# Patient Record
Sex: Female | Born: 1995 | Race: Black or African American | Hispanic: No | Marital: Single | Smoking: Never smoker
Health system: Southern US, Community
[De-identification: ages and names within clinical notes are randomized; demographics above are authoritative.]

## PROBLEM LIST (undated history)

## (undated) DIAGNOSIS — D649 Anemia, unspecified: Secondary | ICD-10-CM

---
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
## 2014-01-03 ENCOUNTER — Observation Stay (HOSPITAL_COMMUNITY)
Admission: EM | Admit: 2014-01-03 | Discharge: 2014-01-04 | Disposition: A | Discharge status: Home / Self Care | Payer: Self-pay | Attending: Otolaryngology | Admitting: Otolaryngology

## 2014-01-03 ENCOUNTER — Encounter (HOSPITAL_COMMUNITY): Payer: Self-pay | Admitting: Emergency Medicine

## 2014-01-03 ENCOUNTER — Emergency Department (HOSPITAL_COMMUNITY): Payer: Self-pay | Admitting: Certified Registered"

## 2014-01-03 ENCOUNTER — Emergency Department (HOSPITAL_COMMUNITY): Payer: Self-pay

## 2014-01-03 ENCOUNTER — Encounter (HOSPITAL_COMMUNITY)
Admission: EM | Disposition: A | Discharge status: Home / Self Care | Payer: Self-pay | Source: Home / Self Care | Attending: Emergency Medicine

## 2014-01-03 ENCOUNTER — Encounter (HOSPITAL_COMMUNITY): Payer: MEDICAID | Admitting: Certified Registered"

## 2014-01-03 DIAGNOSIS — Y998 Other external cause status: Secondary | ICD-10-CM | POA: Insufficient documentation

## 2014-01-03 DIAGNOSIS — Y9239 Other specified sports and athletic area as the place of occurrence of the external cause: Secondary | ICD-10-CM | POA: Insufficient documentation

## 2014-01-03 DIAGNOSIS — S62639A Displaced fracture of distal phalanx of unspecified finger, initial encounter for closed fracture: Secondary | ICD-10-CM | POA: Insufficient documentation

## 2014-01-03 DIAGNOSIS — Y9364 Activity, baseball: Secondary | ICD-10-CM | POA: Insufficient documentation

## 2014-01-03 DIAGNOSIS — S02609A Fracture of mandible, unspecified, initial encounter for closed fracture: Principal | ICD-10-CM | POA: Insufficient documentation

## 2014-01-03 DIAGNOSIS — S02609B Fracture of mandible, unspecified, initial encounter for open fracture: Secondary | ICD-10-CM

## 2014-01-03 DIAGNOSIS — Y92838 Other recreation area as the place of occurrence of the external cause: Secondary | ICD-10-CM

## 2014-01-03 DIAGNOSIS — W219XXA Striking against or struck by unspecified sports equipment, initial encounter: Secondary | ICD-10-CM | POA: Insufficient documentation

## 2014-01-03 HISTORY — PX: ORIF MANDIBULAR FRACTURE: SHX2127

## 2014-01-03 LAB — BASIC METABOLIC PANEL
Anion gap: 16 — ABNORMAL HIGH (ref 5–15)
BUN: 11 mg/dL (ref 6–23)
CHLORIDE: 104 meq/L (ref 96–112)
CO2: 22 mEq/L (ref 19–32)
Calcium: 9.5 mg/dL (ref 8.4–10.5)
Creatinine, Ser: 0.71 mg/dL (ref 0.50–1.10)
GFR calc Af Amer: >90 mL/min (ref >90)
GLUCOSE: 84 mg/dL (ref 70–99)
POTASSIUM: 3.6 meq/L — AB (ref 3.7–5.3)
Sodium: 142 mEq/L (ref 137–147)

## 2014-01-03 LAB — CBC WITH DIFFERENTIAL/PLATELET
Basophils Absolute: 0 10*3/uL (ref 0.0–0.1)
Basophils Relative: 0 % (ref 0–1)
EOS ABS: 0 10*3/uL (ref 0.0–0.7)
Eosinophils Relative: 0 % (ref 0–5)
HEMATOCRIT: 33.6 % — AB (ref 36.0–46.0)
HEMOGLOBIN: 11.3 g/dL — AB (ref 12.0–15.0)
LYMPHS ABS: 1.5 10*3/uL (ref 0.7–4.0)
Lymphocytes Relative: 16 % (ref 12–46)
MCH: 27.6 pg (ref 26.0–34.0)
MCHC: 33.6 g/dL (ref 30.0–36.0)
MCV: 82.2 fL (ref 78.0–100.0)
MONOS PCT: 7 % (ref 3–12)
Monocytes Absolute: 0.7 10*3/uL (ref 0.1–1.0)
NEUTROS PCT: 77 % (ref 43–77)
Neutro Abs: 7.1 10*3/uL (ref 1.7–7.7)
Platelets: 208 10*3/uL (ref 150–400)
RBC: 4.09 MIL/uL (ref 3.87–5.11)
RDW: 14.5 % (ref 11.5–15.5)
WBC: 9.3 10*3/uL (ref 4.0–10.5)

## 2014-01-03 SURGERY — OPEN REDUCTION INTERNAL FIXATION (ORIF) MANDIBULAR FRACTURE
Anesthesia: General | Site: Mouth | Laterality: Bilateral

## 2014-01-03 MED ORDER — CEFAZOLIN SODIUM 1-5 GM-% IV SOLN
1.0000 g | Freq: Once | INTRAVENOUS | Status: AC
  Administered 2014-01-03: 1 g via INTRAVENOUS
  Filled 2014-01-03: qty 50

## 2014-01-03 MED ORDER — ONDANSETRON HCL 4 MG/2ML IJ SOLN
4.0000 mg | Freq: Once | INTRAMUSCULAR | Status: AC
  Administered 2014-01-03: 4 mg via INTRAVENOUS

## 2014-01-03 MED ORDER — FENTANYL CITRATE 0.05 MG/ML IJ SOLN
100.0000 ug | INTRAMUSCULAR | Status: DC | PRN
  Administered 2014-01-03 (×2): 100 ug via INTRAVENOUS
  Filled 2014-01-03 (×3): qty 2

## 2014-01-03 MED ORDER — ONDANSETRON HCL 4 MG/2ML IJ SOLN
INTRAMUSCULAR | Status: AC
  Filled 2014-01-03: qty 2

## 2014-01-03 MED ORDER — ONDANSETRON HCL 4 MG/2ML IJ SOLN
4.0000 mg | Freq: Once | INTRAMUSCULAR | Status: AC
  Administered 2014-01-03: 4 mg via INTRAVENOUS
  Filled 2014-01-03: qty 2

## 2014-01-03 MED ORDER — SODIUM CHLORIDE 0.9 % IV SOLN
INTRAVENOUS | Status: DC
  Administered 2014-01-03: 21:00:00 via INTRAVENOUS

## 2014-01-03 SURGICAL SUPPLY — 48 items
CANISTER SUCTION 2500CC (MISCELLANEOUS) ×2 IMPLANT
CLEANER TIP ELECTROSURG 2X2 (MISCELLANEOUS) ×2 IMPLANT
CONFORMERS SILICONE 5649 (OPHTHALMIC RELATED) IMPLANT
COVER SURGICAL LIGHT HANDLE (MISCELLANEOUS) IMPLANT
COVER TABLE BACK 60X90 (DRAPES) ×2 IMPLANT
CRADLE DONUT ADULT HEAD (MISCELLANEOUS) ×2 IMPLANT
DRAPE PROXIMA HALF (DRAPES) IMPLANT
DRILL WORK LGTH 1.6X58M L 26M (BIT) ×2 IMPLANT
ELECT COATED BLADE 2.86 ST (ELECTRODE) ×2 IMPLANT
ELECT NEEDLE TIP 2.8 STRL (NEEDLE) IMPLANT
ELECT REM PT RETURN 9FT ADLT (ELECTROSURGICAL) ×2
ELECTRODE REM PT RTRN 9FT ADLT (ELECTROSURGICAL) ×1 IMPLANT
GAUZE SPONGE 2X2 8PLY STRL LF (GAUZE/BANDAGES/DRESSINGS) ×1 IMPLANT
GLOVE BIO SURGEON STRL SZ8 (GLOVE) ×2 IMPLANT
GLOVE ECLIPSE 8.0 STRL XLNG CF (GLOVE) ×4 IMPLANT
GOWN STRL REUS W/ TWL LRG LVL3 (GOWN DISPOSABLE) IMPLANT
GOWN STRL REUS W/ TWL XL LVL3 (GOWN DISPOSABLE) ×2 IMPLANT
GOWN STRL REUS W/TWL LRG LVL3 (GOWN DISPOSABLE)
GOWN STRL REUS W/TWL XL LVL3 (GOWN DISPOSABLE) ×2
KIT BASIN OR (CUSTOM PROCEDURE TRAY) ×2 IMPLANT
KIT ROOM TURNOVER OR (KITS) ×2 IMPLANT
NS IRRIG 1000ML POUR BTL (IV SOLUTION) ×2 IMPLANT
PAD ARMBOARD 7.5X6 YLW CONV (MISCELLANEOUS) ×4 IMPLANT
PENCIL BUTTON HOLSTER BLD 10FT (ELECTRODE) ×2 IMPLANT
PLATE 4 H MINI W/BAR (Plate) ×2 IMPLANT
SCISSORS WIRE ANG 4 3/4 DISP (INSTRUMENTS) ×2 IMPLANT
SCREW MNDBLE 2.0X6 BONE (Screw) ×8 IMPLANT
SCREW UPPER FACE 2.0X12MM (Screw) ×8 IMPLANT
SPONGE GAUZE 2X2 STER 10/PKG (GAUZE/BANDAGES/DRESSINGS) ×1
STAPLER VISISTAT 35W (STAPLE) IMPLANT
SUT CHROMIC 3 0 PS 2 (SUTURE) IMPLANT
SUT CHROMIC 3 0 SH 27 (SUTURE) ×2 IMPLANT
SUT CHROMIC 4 0 P 3 18 (SUTURE) ×2 IMPLANT
SUT CHROMIC 5 0 P 3 (SUTURE) IMPLANT
SUT ETHILON 5 0 P 3 18 (SUTURE)
SUT ETHILON 6 0 P 1 (SUTURE) IMPLANT
SUT NYLON ETHILON 5-0 P-3 1X18 (SUTURE) IMPLANT
SUT SILK 2 0 FS (SUTURE) ×2 IMPLANT
SUT STEEL 0 (SUTURE)
SUT STEEL 0 18XMFL TIE 17 (SUTURE) IMPLANT
SUT STEEL 1 (SUTURE) IMPLANT
SUT STEEL 2 (SUTURE) IMPLANT
SUT STEEL 4 (SUTURE) IMPLANT
SYRINGE CONTROL L 12CC (SYRINGE) ×2 IMPLANT
TOWEL OR 17X24 6PK STRL BLUE (TOWEL DISPOSABLE) ×2 IMPLANT
TOWEL OR 17X26 10 PK STRL BLUE (TOWEL DISPOSABLE) ×2 IMPLANT
TRAY ENT MC OR (CUSTOM PROCEDURE TRAY) ×2 IMPLANT
WATER STERILE IRR 1000ML POUR (IV SOLUTION) IMPLANT

## 2014-01-03 NOTE — ED Notes (Signed)
Patient has mouth injury unable to hold temp in mouth
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
## 2014-01-03 NOTE — ED Notes (Signed)
Report received from [facility]. 

## 2014-01-03 NOTE — ED Notes (Signed)
Pt. Refusing fentanyl at this time
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
## 2014-01-04 ENCOUNTER — Observation Stay (HOSPITAL_COMMUNITY): Payer: Self-pay

## 2014-01-04 ENCOUNTER — Encounter (HOSPITAL_COMMUNITY): Payer: Self-pay | Admitting: *Deleted

## 2014-01-04 ENCOUNTER — Emergency Department (HOSPITAL_COMMUNITY): Payer: Self-pay

## 2014-01-04 ENCOUNTER — Encounter (HOSPITAL_COMMUNITY): Payer: Self-pay

## 2014-01-04 LAB — POC URINE PREG, ED: Preg Test, Ur: NEGATIVE

## 2014-01-04 MED ORDER — OXYMETAZOLINE HCL 0.05 % NA SOLN
NASAL | Status: AC
  Filled 2014-01-04: qty 15

## 2014-01-04 MED ORDER — LIDOCAINE-EPINEPHRINE 1 %-1:100000 IJ SOLN
INTRAMUSCULAR | Status: AC
  Filled 2014-01-04: qty 1

## 2014-01-04 MED ORDER — DEXTROSE-NACL 5-0.2 % IV SOLN: INTRAVENOUS | Status: DC

## 2014-01-04 MED ORDER — PROMETHAZINE HCL 25 MG RE SUPP: 25.0000 mg | Freq: Four times a day (QID) | RECTAL | Status: DC | PRN

## 2014-01-04 MED ORDER — MORPHINE SULFATE 2 MG/ML IJ SOLN
INTRAMUSCULAR | Status: AC
  Administered 2014-01-04: 2 mg via INTRAVENOUS
  Filled 2014-01-04: qty 1

## 2014-01-04 MED ORDER — PROMETHAZINE HCL 25 MG/ML IJ SOLN: 25.0000 mg | Freq: Four times a day (QID) | INTRAMUSCULAR | Status: DC | PRN

## 2014-01-04 MED ORDER — HYDROCODONE-ACETAMINOPHEN 7.5-325 MG/15ML PO SOLN: 10.0000 mL | ORAL | Status: DC | PRN

## 2014-01-04 MED ORDER — BACIT-POLY-NEO HC 1 % EX OINT
TOPICAL_OINTMENT | CUTANEOUS | Status: AC
  Filled 2014-01-04: qty 15

## 2014-01-04 MED ORDER — OXYMETAZOLINE HCL 0.05 % NA SOLN
NASAL | Status: DC | PRN
  Administered 2014-01-04: 1 via NASAL

## 2014-01-04 MED ORDER — HYDROCODONE-ACETAMINOPHEN 7.5-325 MG/15ML PO SOLN: 5.0000 mL | ORAL | Status: DC | PRN

## 2014-01-04 MED ORDER — OXYMETAZOLINE HCL 0.05 % NA SOLN: 2.0000 | NASAL | Status: DC | PRN

## 2014-01-04 MED ORDER — MORPHINE SULFATE 2 MG/ML IJ SOLN
1.0000 mg | INTRAMUSCULAR | Status: DC | PRN
  Administered 2014-01-04 (×4): 2 mg via INTRAVENOUS
  Filled 2014-01-04 (×4): qty 1

## 2014-01-04 MED ORDER — HYDROCODONE-ACETAMINOPHEN 7.5-325 MG/15ML PO SOLN
5.0000 mL | ORAL | Status: DC | PRN
Start: 2014-01-04 — End: 2014-01-04

## 2014-01-04 MED ORDER — LIDOCAINE-EPINEPHRINE 1 %-1:100000 IJ SOLN
INTRAMUSCULAR | Status: DC | PRN
  Administered 2014-01-04: 8 mL

## 2014-01-04 MED ORDER — 0.9 % SODIUM CHLORIDE (POUR BTL) OPTIME
TOPICAL | Status: DC | PRN
  Administered 2014-01-04: 1000 mL
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
## 2014-01-04 NOTE — Discharge Summary (Signed)
01/04/2014 10:15 AM  Morton Stall [id number]  Post-Op Day 1    Temp:  [97.5 F (36.4 C)-99 F (37.2 C)] 97.5 F (36.4 C) (08/12 0611) Pulse Rate:  [58-91] 61 (08/12 0611) Resp:  [10-20] 15 (08/12 0611) BP: (127-152)/(68-95) 136/78 mmHg (08/12 0611) SpO2:  [98 %-100 %] 100 % (08/12 0611) Weight:  [150 lb 8 oz (68.266 kg)] 150 lb 8 oz (68.266 kg) (08/11 1846),     Intake/Output Summary (Last 24 hours) at 01/04/14 1015 Last data filed at 01/04/14 0918  Gross per 24 hour  Intake    340 ml  Output      0 ml  Net    340 ml    Results for orders placed during the hospital encounter of 01/03/14 (from the past 24 hour(s))  CBC WITH DIFFERENTIAL     Status: Abnormal   Collection Time    01/03/14  8:20 PM      Result Value Ref Range   WBC 9.3  4.0 - 10.5 K/uL   RBC 4.09  3.87 - 5.11 MIL/uL   Hemoglobin 11.3 (*) 12.0 - 15.0 g/dL   HCT 40.9 (*) 81.1 - 91.4 %   MCV 82.2  78.0 - 100.0 fL   MCH 27.6  26.0 - 34.0 pg   MCHC 33.6  30.0 - 36.0 g/dL   RDW 78.2  95.6 - 21.3 %   Platelets 208  150 - 400 K/uL   Neutrophils Relative % 77  43 - 77 %   Neutro Abs 7.1  1.7 - 7.7 K/uL   Lymphocytes Relative 16  12 - 46 %   Lymphs Abs 1.5  0.7 - 4.0 K/uL   Monocytes Relative 7  3 - 12 %   Monocytes Absolute 0.7  0.1 - 1.0 K/uL   Eosinophils Relative 0  0 - 5 %   Eosinophils Absolute 0.0  0.0 - 0.7 K/uL   Basophils Relative 0  0 - 1 %   Basophils Absolute 0.0  0.0 - 0.1 K/uL  BASIC METABOLIC PANEL     Status: Abnormal   Collection Time    01/03/14  8:20 PM      Result Value Ref Range   Sodium 142  137 - 147 mEq/L   Potassium 3.6 (*) 3.7 - 5.3 mEq/L   Chloride 104  96 - 112 mEq/L   CO2 22  19 - 32 mEq/L   Glucose, Bld 84  70 - 99 mg/dL   BUN 11  6 - 23 mg/dL   Creatinine, Ser 0.86  0.50 - 1.10 mg/dL   Calcium 9.5  8.4 - 57.8 mg/dL   GFR calc non Af Amer >90  >90 mL/min   GFR calc Af Amer >90  >90 mL/min   Anion gap 16 (*) 5 - 15  POC URINE PREG, ED     Status: None   Collection  Time    01/04/14  1:02 AM      Result Value Ref Range   Preg Test, Ur NEGATIVE  NEGATIVE    SUBJECTIVE:  Pain better.  No N,V.  Breathing well  OBJECTIVE:  Less LEFT angle swelling.  Fixation hardware clean and in good position  IMPRESSION:  Satisfactory check.  PLAN:  Liquid diet.  Liquid analgesics.  Recheck my office 2 weeks.  Wire cutters in case of emergency.  Panorex and Dietetics consult pending today.  Admit:  11 AUG Discharge: 12 AUG Final Diagnosis:  Left mandibular angle, RIGHT parasymphyseal  mandible fractures.  Left 3rd finger distal phalanx fracture Proc:  MMF, ORIF RIGHT parasymphyseal fx Comp:  None Cond: ambulatory, pain controlled.  Fixation secure.  Splint on LEFT 3rd finger Recheck:  2 weeks my office Instructions written and given Rx:  Liquid Hydrocodone  Hosp Course:  Underwent surgical repair early 12 AUG. Pain controlled. Taking po fluids.  Fixation secure.  Discharged to home and care of family.  Panorex and Dietetics eval pending at the time of this writing.  [name], Niralya Ohanian
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
## 2014-01-04 NOTE — OR Nursing (Signed)
Epic was down during the time of the procedure. Therefore I charted after the case was over and when Epic was running again.

## 2014-01-04 NOTE — Anesthesia Preprocedure Evaluation (Signed)
Anesthesia Evaluation Anesthesia Physical Anesthesia Plan Anesthesia Quick Evaluation  

## 2014-01-04 NOTE — Progress Notes (Signed)
Orthopedic Tech Progress Note Patient Details:  [name] MinksJennifer A [name] Aug 11, 1995 [id number]  Ortho Devices Type of Ortho Device: Finger splint Ortho Device/Splint Interventions: Application   Haskell Flirtewsome, Kenidy Crossland M 01/04/2014, 8:17 AM

## 2014-01-04 NOTE — Plan of Care (Signed)
Problem: Food- and Nutrition-Related Knowledge Deficit (NB-1.1) Goal: Nutrition education Formal process to instruct or train a patient/client in a skill or to impart knowledge to help patients/clients voluntarily manage or modify food choices and eating behavior to maintain or improve health. Outcome: Completed/Met Date Met:  01/04/14  RD consulted for education regarding Jaw Surgery Nutrition Therapy.   RD provided "Jaw Fracture Nutrition Therapy" handout from the [employer] of [employer]. Reviewed patient's dietary recall. Provided examples on ways to increase caloric density of liquid foods and beverages consumed by the patient. Also provided ideas to promote variety and to incorporate additional nutrient dense liquid foods into patient's diet. Discussed eating small frequent meals and snacks to assist in increasing overall po intake. Teach back method used.  Expect good compliance.  Body mass index is 22.89 kg/(m^2). Pt meets criteria for normal body weight based on current BMI.  Current diet order is clear liquid, patient is consuming approximately 100% of meals at this time. Labs and medications reviewed. No further nutrition interventions warranted at this time. RD contact information provided. If additional nutrition issues arise, please re-consult RD.   Terrace Arabia RD, LDN

## 2014-01-04 NOTE — Discharge Instructions (Signed)
Keep head elevated 3-4 nights OK to brush your teeth with a soft toothbrush Rinse your mouth with cool dilute salt water as often as desired and after meals See instructions from the hospital dietician Keep your wire cutters nearby at all times  You will need to cut 4 loops of wire to open your mouth in case of vomiting Recheck my office 2 weeks please. [phone] for an appointment Call for breathing difficulty, nausea, vomiting. Dr. Janee Mornhompson, hand surgeon, will attend to your fractured finger.  You may be able to find a routine [place]opedic [place] in [place] who can help you with this closer to home.

## 2014-01-05 ENCOUNTER — Encounter (HOSPITAL_COMMUNITY): Payer: Self-pay | Admitting: Otolaryngology

## 2014-01-06 ENCOUNTER — Emergency Department (HOSPITAL_COMMUNITY)
Admission: EM | Admit: 2014-01-06 | Discharge: 2014-01-07 | Disposition: A | Discharge status: Home / Self Care | Payer: Self-pay | Attending: Emergency Medicine | Admitting: Emergency Medicine

## 2014-01-06 ENCOUNTER — Emergency Department (HOSPITAL_COMMUNITY): Payer: Self-pay

## 2014-01-06 ENCOUNTER — Encounter (HOSPITAL_COMMUNITY): Payer: Self-pay | Admitting: Emergency Medicine

## 2014-01-06 DIAGNOSIS — S0993XA Unspecified injury of face, initial encounter: Secondary | ICD-10-CM | POA: Insufficient documentation

## 2014-01-06 DIAGNOSIS — S02650B Fracture of angle of mandible, unspecified side, initial encounter for open fracture: Secondary | ICD-10-CM | POA: Insufficient documentation

## 2014-01-06 DIAGNOSIS — S02609B Fracture of mandible, unspecified, initial encounter for open fracture: Secondary | ICD-10-CM

## 2014-01-06 DIAGNOSIS — T50905A Adverse effect of unspecified drugs, medicaments and biological substances, initial encounter: Secondary | ICD-10-CM

## 2014-01-06 DIAGNOSIS — S199XXA Unspecified injury of neck, initial encounter: Secondary | ICD-10-CM

## 2014-01-06 NOTE — ED Notes (Signed)
Family at bedside. 
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
## 2014-01-06 NOTE — ED Notes (Signed)
Patient transported to X-ray 

## 2014-01-07 MED ORDER — ONDANSETRON 4 MG PO TBDP
8.0000 mg | ORAL_TABLET | Freq: Once | ORAL | Status: AC
  Administered 2014-01-07: 8 mg via ORAL
  Filled 2014-01-07: qty 2

## 2014-01-07 MED ORDER — HYDROCODONE-ACETAMINOPHEN 5-325 MG PO TABS: 1.0000 | ORAL_TABLET | Freq: Four times a day (QID) | ORAL | Status: DC | PRN

## 2014-01-07 MED ORDER — HYDROMORPHONE HCL PF 1 MG/ML IJ SOLN
1.0000 mg | Freq: Once | INTRAMUSCULAR | Status: AC
  Administered 2014-01-07: 1 mg via INTRAMUSCULAR
  Filled 2014-01-07: qty 1

## 2014-01-07 NOTE — Discharge Instructions (Signed)
Mandibular Fracture °A mandibular fracture is a break in the jawbone. °CAUSES  °The most common cause of mandibular fracture is a direct blow (trauma) to the jaw. This could happen from: °· A car crash. °· Physical violence. °· A fall from a high place. °SYMPTOMS  °· Pain. °· Swelling. °· Difficulty and pain when closing the mouth. °· Feeling that the teeth are not aligned properly when closing the mouth (malocclusion). °· Difficulty speaking. °· Difficulty swallowing. °DIAGNOSIS  °Your caregiver will take your history and perform a physical exam. He or she may also order imaging tests, such as X-rays or a computed tomography (CT) scan, to confirm your diagnosis. °TREATMENT  °Surgery is often needed to put the jaw back in the right position. Wires are usually placed around the teeth to hold the jaw in place while it heals. Treatment may also include pain medicine, ice, and a soft or liquid diet. °HOME CARE INSTRUCTIONS  °· Put ice on the injured area. °¨ Put ice in a plastic bag. °¨ Place a towel between your skin and the bag. °¨ Leave the ice on for 15-20 minutes, 03-04 times a day for the first 2 days. °· Only take over-the-counter or prescription medicines for pain, discomfort, or fever as directed by your caregiver. °· Eat a well-balanced, high-protein soft or liquid diet as directed by your caregiver. °· If your jaws are wired, follow your caregiver's instructions for wired jaw care. °· Sleep on your back to avoid putting pressure on your jaw. °· Avoid exercising to the point that you become short of breath. °SEEK MEDICAL CARE IF:  °· You have a severe headache or numbness in your face. °· You have severe jaw pain that is not relieved with medicine. °· Your jaw wires become loose. °· You have uncontrollable nausea or anxiety. °· Your swelling or redness gets worse. °SEEK IMMEDIATE MEDICAL CARE IF: °· You have a fever. °· You have difficulty breathing. °· You feel like your airway is tightening. °· You cannot  swallow your saliva. °· You make a high-pitched whistling sound when you breathe (wheezing). °MAKE SURE YOU:  °· Understand these instructions. °· Will watch your condition. °· Will get help right away if you are not doing well or get worse. °Document Released: 05/12/2005 Document Revised: 08/04/2011 Document Reviewed: 05/28/2011 °ExitCare® Patient Information ©2015 [employer]. This information is not intended to replace advice given to you by your health care provider. Make sure you discuss any questions you have with your health care provider. ° °
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
## 2014-01-07 NOTE — ED Notes (Signed)
Pt reports receiving IV pain med in [facility] right before discharge. Was told to wait 20 minutes then she could go home. States she waited 20  Minutes, got up to walk and became nauseated and became dizzy. Pt thinks this was due to the IV dilaudid she received. Pt states she does not feel

## 2014-01-09 ENCOUNTER — Emergency Department (HOSPITAL_COMMUNITY)
Admission: EM | Admit: 2014-01-09 | Discharge: 2014-01-09 | Disposition: A | Discharge status: Home / Self Care | Payer: MEDICAID | Attending: Emergency Medicine | Admitting: Emergency Medicine

## 2014-01-09 ENCOUNTER — Encounter (HOSPITAL_COMMUNITY): Payer: Self-pay | Admitting: Emergency Medicine

## 2014-01-09 DIAGNOSIS — T8140XA Infection following a procedure, unspecified, initial encounter: Secondary | ICD-10-CM

## 2014-01-09 DIAGNOSIS — Y838 Other surgical procedures as the cause of abnormal reaction of the patient, or of later complication, without mention of misadventure at the time of the procedure: Secondary | ICD-10-CM | POA: Insufficient documentation

## 2014-01-09 DIAGNOSIS — R6884 Jaw pain: Secondary | ICD-10-CM | POA: Insufficient documentation

## 2014-01-09 LAB — CBC WITH DIFFERENTIAL/PLATELET
BASOS ABS: 0 10*3/uL (ref 0.0–0.1)
Basophils Relative: 0 % (ref 0–1)
EOS ABS: 0 10*3/uL (ref 0.0–0.7)
Eosinophils Relative: 0 % (ref 0–5)
HEMATOCRIT: 34.1 % — AB (ref 36.0–46.0)
Hemoglobin: 11.5 g/dL — ABNORMAL LOW (ref 12.0–15.0)
Lymphocytes Relative: 19 % (ref 12–46)
Lymphs Abs: 1.5 10*3/uL (ref 0.7–4.0)
MCH: 27.4 pg (ref 26.0–34.0)
MCHC: 33.7 g/dL (ref 30.0–36.0)
MCV: 81.2 fL (ref 78.0–100.0)
MONO ABS: 0.7 10*3/uL (ref 0.1–1.0)
Monocytes Relative: 9 % (ref 3–12)
Neutro Abs: 5.5 10*3/uL (ref 1.7–7.7)
Neutrophils Relative %: 72 % (ref 43–77)
PLATELETS: 263 10*3/uL (ref 150–400)
RBC: 4.2 MIL/uL (ref 3.87–5.11)
RDW: 13.8 % (ref 11.5–15.5)
WBC: 7.7 10*3/uL (ref 4.0–10.5)

## 2014-01-09 MED ORDER — CLINDAMYCIN HCL 300 MG PO CAPS: 300.0000 mg | ORAL_CAPSULE | Freq: Three times a day (TID) | ORAL | Status: DC

## 2014-01-09 MED ORDER — CLINDAMYCIN HCL 150 MG PO CAPS
300.0000 mg | ORAL_CAPSULE | Freq: Once | ORAL | Status: AC
  Administered 2014-01-09: 300 mg via ORAL
  Filled 2014-01-09: qty 2

## 2014-01-09 MED ORDER — ONDANSETRON 4 MG PO TBDP
4.0000 mg | ORAL_TABLET | Freq: Once | ORAL | Status: AC
  Administered 2014-01-09: 4 mg via ORAL
  Filled 2014-01-09: qty 1

## 2014-01-09 MED FILL — Glycopyrrolate Inj 0.2 MG/ML: INTRAMUSCULAR | Qty: 1 | Status: AC

## 2014-01-09 MED FILL — Dexamethasone Sodium Phosphate Inj 4 MG/ML: INTRAMUSCULAR | Qty: 1 | Status: AC

## 2014-01-09 MED FILL — Lidocaine HCl IV Inj 20 MG/ML: INTRAVENOUS | Qty: 5 | Status: AC

## 2014-01-09 MED FILL — Midazolam HCl Inj 2 MG/2ML (Base Equivalent): INTRAMUSCULAR | Qty: 2 | Status: AC

## 2014-01-09 MED FILL — Ondansetron HCl Inj 4 MG/2ML (2 MG/ML): INTRAMUSCULAR | Qty: 2 | Status: AC

## 2014-01-09 MED FILL — Succinylcholine Chloride Inj 20 MG/ML: INTRAMUSCULAR | Qty: 10 | Status: AC

## 2014-01-09 MED FILL — Propofol IV Emul 10 MG/ML: INTRAVENOUS | Qty: 20 | Status: AC

## 2014-01-09 MED FILL — Sufentanil Citrate Inj 50 MCG/ML: INTRAVENOUS | Qty: 1 | Status: AC
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
## 2014-01-09 NOTE — Discharge Instructions (Signed)
Take your clindamycin as instructed. Crush the antibiotic into applesauce and take 3 times per day. If your fevers worsen or your symptoms worsen or you notice facial swelling return to the ER or see Dr. Lazarus Salines.  Abscess An abscess is an infected area that contains a collection of pus and debris.It can occur in almost any part of the body. An abscess is also known as a furuncle or boil. CAUSES  An abscess occurs when tissue gets infected. This can occur from blockage of oil or sweat glands, infection of hair follicles, or a minor injury to the skin. As the body tries to fight the infection, pus collects in the area and creates pressure under the skin. This pressure causes pain. People with weakened immune systems have difficulty fighting infections and get certain abscesses more often.  SYMPTOMS Usually an abscess develops on the skin and becomes a painful mass that is red, warm, and tender. If the abscess forms under the skin, you may feel a moveable soft area under the skin. Some abscesses break open (rupture) on their own, but most will continue to get worse without care. The infection can spread deeper into the body and eventually into the bloodstream, causing you to feel ill.  DIAGNOSIS  Your caregiver will take your medical history and perform a physical exam. A sample of fluid may also be taken from the abscess to determine what is causing your infection. TREATMENT  Your caregiver may prescribe antibiotic medicines to fight the infection. However, taking antibiotics alone usually does not cure an abscess. Your caregiver may need to make a small cut (incision) in the abscess to drain the pus. In some cases, gauze is packed into the abscess to reduce pain and to continue draining the area. HOME CARE INSTRUCTIONS   Only take over-the-counter or prescription medicines for pain, discomfort, or fever as directed by your caregiver.  If you were prescribed antibiotics, take them as directed. Finish  them even if you start to feel better.  If gauze is used, follow your caregiver's directions for changing the gauze.  To avoid spreading the infection:  Keep your draining abscess covered with a bandage.  Wash your hands well.  Do not share personal care items, towels, or whirlpools with others.  Avoid skin contact with others.  Keep your skin and clothes clean around the abscess.  Keep all follow-up appointments as directed by your caregiver. SEEK MEDICAL CARE IF:   You have increased pain, swelling, redness, fluid drainage, or bleeding.  You have muscle aches, chills, or a general ill feeling.  You have a fever. MAKE SURE YOU:   Understand these instructions.  Will watch your condition.  Will get help right away if you are not doing well or get worse. Document Released: 02/19/2005 Document Revised: 11/11/2011 Document Reviewed: 07/25/2011 [facility] Patient Information 2015 [place], [place]. This information is not intended to replace advice given to you by your health care provider. Make sure you discuss any questions you have with your health care provider.    Wound Infection A wound infection happens when a type of germ (bacteria) starts growing in the wound. In some cases, this can cause the wound to break open. If cared for properly, the infected wound will heal from the inside to the outside. Wound infections need treatment. CAUSES An infection is caused by bacteria growing in the wound.  SYMPTOMS   Increase in redness, swelling, or pain at the wound site.  Increase in drainage at the wound  site.  Wound or bandage (dressing) starts to smell bad.  Fever.  Feeling tired or fatigued.  Pus draining from the wound. TREATMENT  Your health care provider will prescribe antibiotic medicine. The wound infection should improve within 24 to 48 hours. Any redness around the wound should stop spreading and the wound should be less painful.  HOME CARE INSTRUCTIONS    Only take over-the-counter or prescription medicines for pain, discomfort, or fever as directed by your health care provider.  Take your antibiotics as directed. Finish them even if you start to feel better.  Gently wash the area with mild soap and water 2 times a day, or as directed. Rinse off the soap. Pat the area dry with a clean towel. Do not rub the wound. This may cause bleeding.  Follow your health care provider's instructions for how often you need to change the dressing.  Apply ointment and a dressing to the wound as directed.  If the dressing sticks, moisten it with soapy water and gently remove it.  Change the bandage right away if it becomes wet, dirty, or develops a bad smell.  Take showers. Do not take tub baths, swim, or do anything that may soak the wound until it is healed.  Avoid exercises that make you sweat heavily.  Use anti-itch medicine as directed by your health care provider. The wound may itch when it is healing. Do not pick or scratch at the wound.  Follow up with your health care provider to get your wound rechecked as directed. SEEK MEDICAL CARE IF:  You have an increase in swelling, pain, or redness around the wound.  You have an increase in the amount of pus coming from the wound.  There is a bad smell coming from the wound.  More of the wound breaks open.  You have a fever. MAKE SURE YOU:   Understand these instructions.  Will watch your condition.  Will get help right away if you are not doing well or get worse. Document Released: 02/08/2003 Document Revised: 05/17/2013 Document Reviewed: 09/15/2010 [facility] Patient Information 2015 [place]. This information is not intended to replace advice given to you by your health care provider. Make sure you discuss any questions you have with your health care provider.

## 2014-01-09 NOTE — ED Notes (Signed)
Pt states she felt sick after taking antibiotic, VO for zofran 4mg  ODT obtained from EDP.
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
## 2014-01-10 ENCOUNTER — Emergency Department (HOSPITAL_COMMUNITY)
Admission: EM | Admit: 2014-01-10 | Discharge: 2014-01-10 | Disposition: A | Discharge status: Home / Self Care | Payer: MEDICAID | Attending: Emergency Medicine | Admitting: Emergency Medicine

## 2014-01-10 ENCOUNTER — Encounter (HOSPITAL_COMMUNITY): Payer: Self-pay | Admitting: Emergency Medicine

## 2014-01-10 DIAGNOSIS — N949 Unspecified condition associated with female genital organs and menstrual cycle: Secondary | ICD-10-CM | POA: Insufficient documentation

## 2014-01-10 DIAGNOSIS — N751 Abscess of Bartholin's gland: Secondary | ICD-10-CM

## 2014-01-10 DIAGNOSIS — Z792 Long term (current) use of antibiotics: Secondary | ICD-10-CM | POA: Insufficient documentation

## 2014-01-10 MED ORDER — LIDOCAINE HCL (PF) 1 % IJ SOLN
5.0000 mL | Freq: Once | INTRAMUSCULAR | Status: AC
  Administered 2014-01-10: 5 mL via INTRADERMAL
  Filled 2014-01-10: qty 5

## 2014-01-10 MED ORDER — CLINDAMYCIN HCL 150 MG PO CAPS
300.0000 mg | ORAL_CAPSULE | Freq: Once | ORAL | Status: AC
  Administered 2014-01-10: 300 mg via ORAL
  Filled 2014-01-10: qty 2

## 2014-01-10 NOTE — Care Management Note (Signed)
Pt in ED with vaginal abscess and infection in surgical jaw. Pt was here last PM, but was unable to fill Rx for ABX, due to lack of funds. She has a voucher from when she had the surgery, but does not know where it is. She could not use it because her only med/Rx was for an analgesic, and [facility] would not cover this. Pt was given a new voucher because [facility], but has not used it, and is still within the 7 day window, per PDMI site, 8/14-8/21 and she can use this to cover ABX. She is very appreciative of this.

## 2014-01-10 NOTE — ED Provider Notes (Signed)
CSN: [id number]     Arrival date & time 01/10/14  1225 History   First MD Initiated Contact with Patient 01/10/14 1252     Chief Complaint  Patient presents with  . Abscess     (Consider location/radiation/quality/duration/timing/severity/associated sxs/prior Treatment) HPI Venia MinksJennifer A Reiger is a 18 y.o. female who presents to the Emergency Department complaining of pain and swelling to her vaginal area.  She is concerned that she has a boil. She reports swelling of the left labia.   She states the symptoms have been present for several days, but became worse on the morning of ED arrival.  Pain is worse with standing and with wiping after urination.  She has been taking  norco w/o relief.  She denies drainage, fever, chills, abdominal pain, vaginal bleeding or discharge.     History reviewed. No pertinent past medical history. Past Surgical History  Procedure Laterality Date  . Orif mandibular fracture Bilateral 01/03/2014    Procedure: MAXILLARY MANDIBULAR FIXATION ,OPEN REDUCTION INTERNAL FIXATION (ORIF) MANDIBULAR FRACTURE, ;  Surgeon: Flo ShanksKarol Wolicki, MD;  [facility];  Service: ENT;  Laterality: Bilateral;   No family history on file. History  Substance Use Topics  . Smoking status: Never Smoker   . Smokeless tobacco: Never Used  . Alcohol Use: No   OB History   Grav Para Term Preterm Abortions TAB SAB Ect Mult Living                 Review of Systems  Constitutional: Negative for fever and chills.  Gastrointestinal: Negative for nausea and vomiting.  Musculoskeletal: Negative for arthralgias and joint swelling.  Skin: Positive for color change.       Abscess   Hematological: Negative for adenopathy.  All other systems reviewed and are negative.     Allergies  Nickel  Home Medications   Prior to Admission medications   Medication Sig Start Date End Date Taking? Authorizing Provider  Acetaminophen (TYLENOL EXTRA STRENGTH) 167 MG/5ML LIQD Take 30 mLs by mouth  every 4 (four) hours as needed (for pain).   Yes Historical Provider, MD  HYDROcodone-acetaminophen (NORCO) 5-325 MG per tablet Take 1-2 tablets by mouth every 6 (six) hours as needed for moderate pain. 01/07/14  Yes Juliet RudeNathan R. Pickering, MD  clindamycin (CLEOCIN) 300 MG capsule Take 1 capsule (300 mg total) by mouth 3 (three) times daily. X 7 days 01/09/14   Audree CamelScott T Goldston, MD   BP 145/94  Pulse 99  Temp(Src) 97.9 F (36.6 C) (Oral)  Resp 20  SpO2 100%  LMP 12/10/2013 Physical Exam  Nursing note and vitals reviewed. Constitutional: She is oriented to person, place, and time. She appears well-developed and well-nourished. No distress.  HENT:  Head: Normocephalic and atraumatic.  Pt has wired jaw  Cardiovascular: Normal rate, regular rhythm and normal heart sounds.   No murmur heard. Pulmonary/Chest: Effort normal and breath sounds normal. No respiratory distress.  Abdominal: Soft. She exhibits no distension. There is no tenderness. There is no rebound and no guarding.  Genitourinary:  Fluctuant mass to the left inner labia minora.  No drainage.  STS of the left labia majora  Musculoskeletal: Normal range of motion.  Neurological: She is alert and oriented to person, place, and time. She exhibits normal muscle tone. Coordination normal.  Skin: Skin is warm and dry.    ED Course  Procedures (including critical care time) Labs Review Labs Reviewed - No data to display  Imaging Review No results found.  EKG Interpretation None      MDM   Final diagnoses:  Bartholin's gland abscess    INCISION AND DRAINAGE Performed by: Maxwell Caul. Consent: Verbal consent obtained. Risks and benefits: risks, benefits and alternatives were discussed Type: abscess  Body area: left labia minora Anesthesia: local infiltration  Incision was made with a #11 scalpel.  Local anesthetic: lidocaine 1 % w/o epinephrine  Anesthetic total: 3 ml  Complexity: complex Blunt dissection  to break up loculations  Drainage: purulent  Drainage amount: copious Packing material:   Word catheter placed   Patient tolerance: Patient tolerated the procedure well with no immediate complications.    Pt is well appearing.  Has a RX for cleocin from her previous ED visit for jaw pain and infection that she has not filled.  She spoke case worker and she was given a voucher to pay for the prescription and agrees to get it filled after discharge.  Single dose given here.  Pt agrees to warm water soaks, and to return here in 2 days for removal of the catheter.  She is well appearing and stable for d/c  Laya Letendre L. Trisha Mangle, PA-C 01/11/14 2157

## 2014-01-10 NOTE — Care Management Note (Signed)
Pt was also given the [employer] Resource Handout and a drug discount card.

## 2014-01-10 NOTE — Discharge Instructions (Signed)
Bartholin's Cyst or Abscess °Bartholin's glands are small glands located within the folds of skin (labia) along the sides of the lower opening of the vagina (birth canal). A cyst may develop when the duct of the gland becomes blocked. When this happens, fluid that accumulates within the cyst can become infected. This is known as an abscess. The Bartholin gland produces a mucous fluid to lubricate the outside of the vagina during sexual intercourse. °SYMPTOMS  °· Patients with a small cyst may not have any symptoms. °· Mild discomfort to severe pain depending on the size of the cyst and if it is infected (abscess). °· Pain, redness, and swelling around the lower opening of the vagina. °· Painful intercourse. °· Pressure in the perineal area. °· Swelling of the lips of the vagina (labia). °· The cyst or abscess can be on one side or both sides of the vagina. °DIAGNOSIS  °· A large swelling is seen in the lower vagina area by your caregiver. °· Painful to touch. °· Redness and pain, if it is an abscess. °TREATMENT  °· Sometimes the cyst will go away on its own. °· Apply warm wet compresses to the area or take hot sitz baths several times a day. °· An incision to drain the cyst or abscess with local anesthesia. °· Culture the pus, if it is an abscess. °· Antibiotic treatment, if it is an abscess. °· Cut open the gland and suture the edges to make the opening of the gland bigger (marsupialization). °· Remove the whole gland if the cyst or abscess returns. °PREVENTION  °· Practice good hygiene. °· Clean the vaginal area with a mild soap and soft cloth when bathing. °· Do not rub hard in the vaginal area when bathing. °· Protect the crotch area with a padded cushion if you take long bike rides or ride horses. °· Be sure you are well lubricated when you have sexual intercourse. °HOME CARE INSTRUCTIONS  °· If your cyst or abscess was opened, a small piece of gauze, or a drain, may have been placed in the wound to allow  drainage. Do not remove this gauze or drain unless directed by your caregiver. °· Wear feminine pads, not tampons, as needed for any drainage or bleeding. °· If antibiotics were prescribed, take them exactly as directed. Finish the entire course. °· Only take over-the-counter or prescription medicines for pain, discomfort, or fever as directed by your caregiver. °SEEK IMMEDIATE MEDICAL CARE IF:  °· You have an increase in pain, redness, swelling, or drainage. °· You have bleeding from the wound which results in the use of more than the number of pads suggested by your caregiver in 24 hours. °· You have chills. °· You have a fever. °· You develop any new problems (symptoms) or aggravation of your existing condition. °MAKE SURE YOU:  °· Understand these instructions. °· Will watch your condition. °· Will get help right away if you are not doing well or get worse. °Document Released: 05/12/2005 Document Revised: 08/04/2011 Document Reviewed: 12/29/2007 °ExitCare® Patient Information ©2015 [employer]. This information is not intended to replace advice given to you by your health care provider. Make sure you discuss any questions you have with your health care provider. ° °

## 2014-01-10 NOTE — ED Notes (Signed)
Pt c/o vaginal abscess that became worse this am,

## 2014-01-12 NOTE — ED Provider Notes (Signed)
Medical screening examination/treatment/procedure(s) were performed by non-physician practitioner and as supervising physician I was immediately available for consultation/collaboration.   EKG Interpretation None        [name] [name] F Cheyanne Lamison, MD 01/12/14 1254 

## 2014-02-09 ENCOUNTER — Other Ambulatory Visit (HOSPITAL_COMMUNITY): Payer: Self-pay | Admitting: *Deleted

## 2014-02-09 ENCOUNTER — Encounter (HOSPITAL_COMMUNITY): Payer: Self-pay | Admitting: Pharmacy Technician

## 2014-02-09 NOTE — Pre-Procedure Instructions (Addendum)
ANALIE [name]  02/09/2014   Your procedure is scheduled on:  Tuesday, February 21, 2014    Report to [facility] Entrance "A" Admitting Office at 6:30 AM.   Call this number if you have problems the morning of surgery: [phone]   Remember:   Do not eat food or drink liquids after midnight Monday, 02/20/14.   Take these medicines the morning of surgery with A SIP OF WATER: None     STOP all herbel meds, nsaids (aleve,naproxen,advil,ibuprofen) 5 days prior to surgery(02/16/14) including vitamins, aspirin   Do not wear jewelry, make-up or nail polish.  Do not wear lotions, powders, or perfumes. You may wear deodorant.  Do not shave 48 hours prior to surgery.   Do not bring valuables to the hospital.  [facility] is not responsible                  for any belongings or valuables.               Contacts, dentures or bridgework may not be worn into surgery.  Leave suitcase in the car. After surgery it may be brought to your room.  For patients admitted to the hospital, discharge time is determined by your                treatment team.               Patients discharged the day of surgery will not be allowed to drive home.    Special Instructions:  - Preparing for Surgery  Before surgery, you can play an important role.  Because skin is not sterile, your skin needs to be as free of germs as possible.  You can reduce the number of germs on you skin by washing with CHG (chlorahexidine gluconate) soap before surgery.  CHG is an antiseptic cleaner which kills germs and bonds with the skin to continue killing germs even after washing.  Please DO NOT use if you have an allergy to CHG or antibacterial soaps.  If your skin becomes reddened/irritated stop using the CHG and inform your nurse when you arrive at Short Stay.  Do not shave (including legs and underarms) for at least 48 hours prior to the first CHG shower.  You may shave your face.  Please follow these  instructions carefully:   1.  Shower with CHG Soap the night before surgery and the                                morning of Surgery.  2.  If you choose to wash your hair, wash your hair first as usual with your       normal shampoo.  3.  After you shampoo, rinse your hair and body thoroughly to remove the                      Shampoo.  4.  Use CHG as you would any other liquid soap.  You can apply chg directly       to the skin and wash gently with scrungie or a clean washcloth.  5.  Apply the CHG Soap to your body ONLY FROM THE NECK DOWN.        Do not use on open wounds or open sores.  Avoid contact with your eyes, ears, mouth and genitals (private parts).  Wash genitals (private parts) with  your normal soap.  6.  Wash thoroughly, paying special attention to the area where your surgery        will be performed.  7.  Thoroughly rinse your body with warm water from the neck down.  8.  DO NOT shower/wash with your normal soap after using and rinsing off       the CHG Soap.  9.  Pat yourself dry with a clean towel.            10.  Wear clean pajamas.            11.  Place clean sheets on your bed the night of your first shower and do not        sleep with pets.  Day of Surgery  Do not apply any lotions the morning of surgery.  Please wear clean clothes to the hospital/surgery center.     Please read over the following fact sheets that you were given: Pain Booklet, Coughing and Deep Breathing and Surgical Site Infection Prevention

## 2014-02-10 ENCOUNTER — Encounter (HOSPITAL_COMMUNITY)
Admission: RE | Admit: 2014-02-10 | Discharge: 2014-02-10 | Disposition: A | Discharge status: Home / Self Care | Payer: Self-pay | Source: Ambulatory Visit | Attending: Otolaryngology | Admitting: Otolaryngology

## 2014-02-10 ENCOUNTER — Encounter (HOSPITAL_COMMUNITY): Payer: Self-pay

## 2014-02-10 DIAGNOSIS — Z01812 Encounter for preprocedural laboratory examination: Secondary | ICD-10-CM | POA: Insufficient documentation

## 2014-02-10 DIAGNOSIS — Z472 Encounter for removal of internal fixation device: Secondary | ICD-10-CM | POA: Insufficient documentation

## 2014-02-10 HISTORY — DX: Anemia, unspecified: D64.9

## 2014-02-10 LAB — CBC
HCT: 34.4 % — ABNORMAL LOW (ref 36.0–46.0)
Hemoglobin: 11.3 g/dL — ABNORMAL LOW (ref 12.0–15.0)
MCH: 27.1 pg (ref 26.0–34.0)
MCHC: 32.8 g/dL (ref 30.0–36.0)
MCV: 82.5 fL (ref 78.0–100.0)
PLATELETS: 254 10*3/uL (ref 150–400)
RBC: 4.17 MIL/uL (ref 3.87–5.11)
RDW: 14.3 % (ref 11.5–15.5)
WBC: 4.8 10*3/uL (ref 4.0–10.5)

## 2014-02-10 LAB — HCG, SERUM, QUALITATIVE: PREG SERUM: NEGATIVE

## 2014-02-20 ENCOUNTER — Other Ambulatory Visit: Payer: Self-pay | Admitting: Otolaryngology
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
## 2014-02-21 ENCOUNTER — Ambulatory Visit (HOSPITAL_COMMUNITY): Payer: Self-pay | Admitting: Certified Registered"

## 2014-02-21 ENCOUNTER — Ambulatory Visit (HOSPITAL_COMMUNITY): Payer: Self-pay

## 2014-02-21 ENCOUNTER — Encounter (HOSPITAL_COMMUNITY): Payer: Self-pay | Admitting: *Deleted

## 2014-02-21 ENCOUNTER — Ambulatory Visit (HOSPITAL_BASED_OUTPATIENT_CLINIC_OR_DEPARTMENT_OTHER)
Admission: RE | Admit: 2014-02-21 | Discharge: 2014-02-21 | Disposition: A | Discharge status: Home / Self Care | Payer: Self-pay | Source: Ambulatory Visit | Attending: Otolaryngology | Admitting: Otolaryngology

## 2014-02-21 ENCOUNTER — Encounter (HOSPITAL_COMMUNITY): Payer: Self-pay | Admitting: Certified Registered"

## 2014-02-21 ENCOUNTER — Encounter (HOSPITAL_COMMUNITY)
Admission: RE | Disposition: A | Discharge status: Home / Self Care | Payer: Self-pay | Source: Ambulatory Visit | Attending: Otolaryngology

## 2014-02-21 DIAGNOSIS — Z472 Encounter for removal of internal fixation device: Secondary | ICD-10-CM | POA: Insufficient documentation

## 2014-02-21 DIAGNOSIS — Z9109 Other allergy status, other than to drugs and biological substances: Secondary | ICD-10-CM | POA: Insufficient documentation

## 2014-02-21 HISTORY — PX: MANDIBULAR HARDWARE REMOVAL: SHX5205

## 2014-02-21 SURGERY — REMOVAL, HARDWARE, MANDIBLE
Anesthesia: Monitor Anesthesia Care | Site: Mouth

## 2014-02-21 MED ORDER — OXYCODONE HCL 5 MG/5ML PO SOLN: 5.0000 mg | Freq: Once | ORAL | Status: DC | PRN

## 2014-02-21 MED ORDER — LIDOCAINE-EPINEPHRINE 1 %-1:100000 IJ SOLN
INTRAMUSCULAR | Status: AC
  Filled 2014-02-21: qty 1

## 2014-02-21 MED ORDER — LIDOCAINE HCL (CARDIAC) 20 MG/ML IV SOLN
INTRAVENOUS | Status: AC
  Filled 2014-02-21: qty 5

## 2014-02-21 MED ORDER — OXYMETAZOLINE HCL 0.05 % NA SOLN
2.0000 | NASAL | Status: AC
  Administered 2014-02-21 (×2): 2 via NASAL
  Filled 2014-02-21: qty 15

## 2014-02-21 MED ORDER — FENTANYL CITRATE 0.05 MG/ML IJ SOLN: 25.0000 ug | INTRAMUSCULAR | Status: DC | PRN

## 2014-02-21 MED ORDER — CEFAZOLIN SODIUM-DEXTROSE 2-3 GM-% IV SOLR
INTRAVENOUS | Status: AC
  Filled 2014-02-21: qty 50

## 2014-02-21 MED ORDER — PROPOFOL 10 MG/ML IV BOLUS
INTRAVENOUS | Status: AC
  Filled 2014-02-21: qty 20

## 2014-02-21 MED ORDER — FENTANYL CITRATE 0.05 MG/ML IJ SOLN
INTRAMUSCULAR | Status: AC
  Filled 2014-02-21: qty 5

## 2014-02-21 MED ORDER — OXYCODONE HCL 5 MG PO TABS: 5.0000 mg | ORAL_TABLET | Freq: Once | ORAL | Status: DC | PRN

## 2014-02-21 MED ORDER — MIDAZOLAM HCL 5 MG/5ML IJ SOLN
INTRAMUSCULAR | Status: DC | PRN
  Administered 2014-02-21: 2 mg via INTRAVENOUS

## 2014-02-21 MED ORDER — LIDOCAINE HCL (CARDIAC) 20 MG/ML IV SOLN
INTRAVENOUS | Status: DC | PRN
  Administered 2014-02-21: 40 mg via INTRAVENOUS

## 2014-02-21 MED ORDER — LACTATED RINGERS IV SOLN
INTRAVENOUS | Status: DC
  Administered 2014-02-21: 09:00:00 via INTRAVENOUS

## 2014-02-21 MED ORDER — 0.9 % SODIUM CHLORIDE (POUR BTL) OPTIME
TOPICAL | Status: DC | PRN
  Administered 2014-02-21: 1000 mL

## 2014-02-21 MED ORDER — MIDAZOLAM HCL 2 MG/2ML IJ SOLN
INTRAMUSCULAR | Status: AC
  Filled 2014-02-21: qty 2

## 2014-02-21 MED ORDER — PROPOFOL 10 MG/ML IV BOLUS
INTRAVENOUS | Status: DC | PRN
  Administered 2014-02-21: 30 mg via INTRAVENOUS
  Administered 2014-02-21 (×2): 20 mg via INTRAVENOUS
  Administered 2014-02-21: 30 mg via INTRAVENOUS
  Administered 2014-02-21: 20 mg via INTRAVENOUS

## 2014-02-21 MED ORDER — HYDROCODONE-ACETAMINOPHEN 7.5-325 MG/15ML PO SOLN: 10.0000 mL | ORAL | Status: DC | PRN

## 2014-02-21 MED ORDER — ONDANSETRON HCL 4 MG/2ML IJ SOLN
INTRAMUSCULAR | Status: DC | PRN
  Administered 2014-02-21: 4 mg via INTRAVENOUS

## 2014-02-21 MED ORDER — FENTANYL CITRATE 0.05 MG/ML IJ SOLN
INTRAMUSCULAR | Status: AC
  Filled 2014-02-21: qty 2

## 2014-02-21 MED ORDER — CEFAZOLIN SODIUM-DEXTROSE 2-3 GM-% IV SOLR
2.0000 g | INTRAVENOUS | Status: AC
  Administered 2014-02-21: 2 g via INTRAVENOUS

## 2014-02-21 MED ORDER — ONDANSETRON HCL 4 MG/2ML IJ SOLN
INTRAMUSCULAR | Status: AC
  Filled 2014-02-21: qty 2

## 2014-02-21 MED ORDER — FENTANYL CITRATE 0.05 MG/ML IJ SOLN
INTRAMUSCULAR | Status: DC | PRN
  Administered 2014-02-21: 50 ug via INTRAVENOUS

## 2014-02-21 MED ORDER — PROMETHAZINE HCL 25 MG/ML IJ SOLN: 6.2500 mg | INTRAMUSCULAR | Status: DC | PRN

## 2014-02-21 SURGICAL SUPPLY — 24 items
CANISTER SUCTION 2500CC (MISCELLANEOUS) ×2 IMPLANT
CRADLE DONUT ADULT HEAD (MISCELLANEOUS) ×2 IMPLANT
DRAPE PROXIMA HALF (DRAPES) ×2 IMPLANT
GAUZE SPONGE 4X4 16PLY XRAY LF (GAUZE/BANDAGES/DRESSINGS) ×2 IMPLANT
GLOVE BIO SURGEON STRL SZ7 (GLOVE) ×2 IMPLANT
GLOVE BIOGEL PI IND STRL 7.0 (GLOVE) ×1 IMPLANT
GLOVE BIOGEL PI INDICATOR 7.0 (GLOVE) ×1
GLOVE ECLIPSE 8.0 STRL XLNG CF (GLOVE) ×2 IMPLANT
GLOVE SURG SS PI 6.5 STRL IVOR (GLOVE) ×2 IMPLANT
GOWN STRL REUS W/ TWL LRG LVL3 (GOWN DISPOSABLE) ×1 IMPLANT
GOWN STRL REUS W/ TWL XL LVL3 (GOWN DISPOSABLE) ×1 IMPLANT
GOWN STRL REUS W/TWL LRG LVL3 (GOWN DISPOSABLE) ×1
GOWN STRL REUS W/TWL XL LVL3 (GOWN DISPOSABLE) ×1
KIT ROOM TURNOVER OR (KITS) ×2 IMPLANT
NEEDLE HYPO 25X1 1.5 SAFETY (NEEDLE) IMPLANT
NS IRRIG 1000ML POUR BTL (IV SOLUTION) ×2 IMPLANT
PACK EENT II TURBAN DRAPE (CUSTOM PROCEDURE TRAY) ×2 IMPLANT
PAD ARMBOARD 7.5X6 YLW CONV (MISCELLANEOUS) ×2 IMPLANT
SUT CHROMIC 3 0 SH 27 (SUTURE) IMPLANT
SUT STEEL 2 (SUTURE) IMPLANT
SUT STEEL 4 (SUTURE) IMPLANT
SYR CONTROL 10ML LL (SYRINGE) IMPLANT
TOWEL OR 17X24 6PK STRL BLUE (TOWEL DISPOSABLE) ×4 IMPLANT
TUBE CONNECTING 12X1/4 (SUCTIONS) ×2 IMPLANT

## 2014-02-21 NOTE — Op Note (Signed)
02/21/2014  10:25 AM    Morton StallFord, Consuello  [id number]   Pre-Op Dx:  Mandibular fractures  Post-op Dx: same  Proc: removal MMF   Surg:  [name], Quin Mathenia T MD  Anes:  General IV + Mask  EBL:  min  Comp:  none  Findings:  Healed wires and screws with stable fixation.  Upon mobilizing the jaw, LEFT angle fx feels stable and immobile  Procedure: with the patient in a comfortable supine position, general mask and IV anesthesia was administered.  Pre op panorex was reviewed.  Wires were cut.  The mandible was mobilized with the findings as described as above.  Complete removal of hardward was elected.  Using a Therapist, nutritionalreer elevator, the screws were exposed and removed.  The wounds were not closed.  Hemostasis was observed.    Dispo:   PACU to home  Plan:  Ice, analgesia, slow advancement of diet.  hyrdrocodone for pain relief.  Recheck my office 2 weeks.   Cephus RicherWOLICKI,  Imara Standiford T MD
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
## 2014-02-21 NOTE — Discharge Instructions (Signed)
OK to brush teeth with soft toothbrush Make an appointment with your dentist soon to clean your teeth properly Advance diet slowly over the next month, beginning with soft solids, then moving to more dense solids. Recheck my office 2 weeks.  Call [phone] for an appointment Hydrocodone liquid for pain relief Rinse your mouth with cool dilute salt water after meals and every few hours for the next 4-5 days.  What to Eat after Tooth extraction:     For your first meals, you should eat lightly; only small meals at first.   Avoid Sharp, Crunchy, and Hot foods.   If you do not have nausea, you may eat larger meals.  Avoid spicy, greasy and heavy food, as these may make you sick after the anesthesia.  . General Anesthesia, Adult, Care After  Refer to this sheet in the next few weeks. These instructions provide you with information on caring for yourself after your procedure. Your health care provider may also give you more specific instructions. Your treatment has been planned according to current medical practices, but problems sometimes occur. Call your health care provider if you have any problems or questions after your procedure.  WHAT TO EXPECT AFTER THE PROCEDURE  After the procedure, it is typical to experience:  Sleepiness.  Nausea and vomiting. HOME CARE INSTRUCTIONS  For the first 24 hours after general anesthesia:  Have a responsible person with you.  Do not drive a car. If you are alone, do not take public transportation.  Do not drink alcohol.  Do not take medicine that has not been prescribed by your health care provider.  Do not sign important papers or make important decisions.  You may resume a normal diet and activities as directed by your health care provider.  Change bandages (dressings) as directed.  If you have questions or problems that seem related to general anesthesia, call the hospital and ask for the anesthetist or anesthesiologist on call. SEEK MEDICAL CARE IF:    You have nausea and vomiting that continue the day after anesthesia.  You develop a rash. SEEK IMMEDIATE MEDICAL CARE IF:  You have difficulty breathing.  You have chest pain.  You have any allergic problems. Document Released: 08/18/2000 Document Revised: 01/12/2013 Document Reviewed: 11/25/2012  [facility] Patient Information 2014 [employer] [place].

## 2014-02-21 NOTE — Anesthesia Postprocedure Evaluation (Signed)
  Anesthesia Post-op Note  Patient: [name] [name]  Procedure(s) Performed: Procedure(s): REMOVAL OF MANDIBULAR MAXILLARY FIXATION (N/A)  Patient Location: PACU  Anesthesia Type:MAC  Level of Consciousness: awake and sedated  Airway and Oxygen Therapy: Patient Spontanous Breathing  Post-op Pain: mild  Post-op Assessment: Post-op Vital signs reviewed  Post-op Vital Signs: stable  Last Vitals:  Filed Vitals:   02/21/14 1045  BP:   Pulse: 45  Temp:   Resp: 16    Complications: No apparent anesthesia complications

## 2014-02-21 NOTE — Anesthesia Preprocedure Evaluation (Addendum)
Anesthesia Evaluation  Patient identified by MRN, date of birth, ID band Patient awake    Reviewed: Allergy & Precautions, H&P , NPO status , Patient's Chart, lab work & pertinent test results  Airway Mallampati: IV  Neck ROM: Full  Mouth opening: Limited Mouth Opening  Dental  (+) Teeth Intact, Dental Advisory Given   Pulmonary  breath sounds clear to auscultation        Cardiovascular Rhythm:Regular Rate:Normal     Neuro/Psych    GI/Hepatic   Endo/Other    Renal/GU      Musculoskeletal   Abdominal   Peds  Hematology   Anesthesia Other Findings   Reproductive/Obstetrics                          Anesthesia Physical Anesthesia Plan  ASA: I  Anesthesia Plan: MAC   Post-op Pain Management:    Induction: Intravenous  Airway Management Planned: Natural Airway  Additional Equipment:   Intra-op Plan:   Post-operative Plan:   Informed Consent: I have reviewed the patients History and Physical, chart, labs and discussed the procedure including the risks, benefits and alternatives for the proposed anesthesia with the patient or authorized representative who has indicated his/her understanding and acceptance.     Plan Discussed with:   Anesthesia Plan Comments:         Anesthesia Quick Evaluation

## 2014-02-21 NOTE — Transfer of Care (Signed)
Immediate Anesthesia Transfer of Care Note  Patient: [name] MinksJennifer A [name]  Procedure(s) Performed: Procedure(s): REMOVAL OF MANDIBULAR MAXILLARY FIXATION (N/A)  Patient Location: PACU  Anesthesia Type:MAC  Level of Consciousness: awake, alert  and oriented  Airway & Oxygen Therapy: Patient Spontanous Breathing  Post-op Assessment: Report given to PACU RN  Post vital signs: Reviewed and stable  Complications: No apparent anesthesia complications

## 2014-02-21 NOTE — Interval H&P Note (Signed)
History and Physical Interval Note:  02/21/2014 9:17 AM  [name] [name]  has presented today for surgery, with the diagnosis of MANDIBLE FRACTURES  The various methods of treatment have been discussed with the patient and family. After consideration of risks, benefits and other options for treatment, the patient has consented to  Procedure(s): REMOVAL OF MANDIBULAR MAXILLARY FIXATION (N/A) as a surgical intervention .  The patient's history has been re-reviewed, patient re-examined, no change in status, stable for surgery.  I have re-reviewed the patient's chart and labs.  Questions were answered to the patient's satisfaction.     [name], [name]

## 2014-02-22 ENCOUNTER — Encounter (HOSPITAL_COMMUNITY): Payer: Self-pay | Admitting: Otolaryngology

## 2014-04-04 ENCOUNTER — Encounter (HOSPITAL_COMMUNITY): Payer: Self-pay | Admitting: Emergency Medicine

## 2014-04-04 ENCOUNTER — Emergency Department (HOSPITAL_COMMUNITY)
Admission: EM | Admit: 2014-04-04 | Discharge: 2014-04-05 | Disposition: A | Discharge status: Home / Self Care | Payer: Self-pay | Attending: Emergency Medicine | Admitting: Emergency Medicine

## 2014-04-04 ENCOUNTER — Emergency Department (HOSPITAL_COMMUNITY): Payer: Self-pay

## 2014-04-04 DIAGNOSIS — Z79899 Other long term (current) drug therapy: Secondary | ICD-10-CM | POA: Insufficient documentation

## 2014-04-04 DIAGNOSIS — Z862 Personal history of diseases of the blood and blood-forming organs and certain disorders involving the immune mechanism: Secondary | ICD-10-CM | POA: Insufficient documentation

## 2014-04-04 DIAGNOSIS — S02609G Fracture of mandible, unspecified, subsequent encounter for fracture with delayed healing: Secondary | ICD-10-CM

## 2014-04-04 DIAGNOSIS — X58XXXA Exposure to other specified factors, initial encounter: Secondary | ICD-10-CM | POA: Insufficient documentation

## 2014-04-04 DIAGNOSIS — R6884 Jaw pain: Secondary | ICD-10-CM

## 2014-04-04 MED ORDER — FENTANYL CITRATE 0.05 MG/ML IJ SOLN
100.0000 ug | Freq: Once | INTRAMUSCULAR | Status: AC
  Administered 2014-04-04: 100 ug via INTRAVENOUS
  Filled 2014-04-04: qty 2
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
## 2014-04-05 MED ORDER — KETOROLAC TROMETHAMINE 30 MG/ML IJ SOLN
30.0000 mg | Freq: Once | INTRAMUSCULAR | Status: AC
  Administered 2014-04-05: 30 mg via INTRAVENOUS
  Filled 2014-04-05: qty 1

## 2014-04-05 MED ORDER — HYDROMORPHONE HCL 1 MG/ML IJ SOLN
0.5000 mg | Freq: Once | INTRAMUSCULAR | Status: AC
  Administered 2014-04-05: 0.5 mg via INTRAVENOUS
  Filled 2014-04-05: qty 1

## 2014-04-05 MED ORDER — HYDROCODONE-ACETAMINOPHEN 7.5-325 MG/15ML PO SOLN: 15.0000 mL | Freq: Four times a day (QID) | ORAL | Status: DC | PRN

## 2014-04-05 NOTE — Discharge Instructions (Signed)
Scan shows no new fractures in your jaw.  Prescription for liquid pain medicine. Have her prescription runs out you can take Tylenol and/or ibuprofen. Liquid diet for a couple days. Follow-up with Dr. Lazarus SalinesWolicki.  Phone number given.

## 2014-04-18 ENCOUNTER — Encounter (HOSPITAL_COMMUNITY): Payer: Self-pay | Admitting: *Deleted

## 2014-04-18 ENCOUNTER — Emergency Department (HOSPITAL_COMMUNITY)
Admission: EM | Admit: 2014-04-18 | Discharge: 2014-04-18 | Discharge status: Against Medical Advice | Payer: MEDICAID | Attending: Emergency Medicine | Admitting: Emergency Medicine

## 2014-04-18 DIAGNOSIS — Y9389 Activity, other specified: Secondary | ICD-10-CM | POA: Insufficient documentation

## 2014-04-18 DIAGNOSIS — S0993XA Unspecified injury of face, initial encounter: Secondary | ICD-10-CM | POA: Insufficient documentation

## 2014-04-18 DIAGNOSIS — Y998 Other external cause status: Secondary | ICD-10-CM | POA: Insufficient documentation

## 2014-04-18 DIAGNOSIS — S0990XA Unspecified injury of head, initial encounter: Secondary | ICD-10-CM | POA: Insufficient documentation

## 2014-04-18 DIAGNOSIS — Y9289 Other specified places as the place of occurrence of the external cause: Secondary | ICD-10-CM | POA: Insufficient documentation

## 2014-04-18 NOTE — ED Notes (Signed)
Pt states she is having chin pain and pt states she feels her chin is swelling. Pt had recent jaw surgery. Pt states she hit her head on the door knob and feels drowsy. NAD noted at this time. Pt is alert and oriented x4
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
## 2014-04-18 NOTE — ED Provider Notes (Signed)
Patient left the ED before my evaluation and was not evaluated.  [name] OctaveStephen Mattix Imhof, MD 04/18/14 1321

## 2014-04-18 NOTE — ED Notes (Signed)
PT signed out AMA. Risks explained to pt about risks of leaving before MD eval and accepted and signed AMA forms.

## 2014-05-02 ENCOUNTER — Emergency Department (HOSPITAL_COMMUNITY): Payer: Self-pay

## 2014-05-02 ENCOUNTER — Encounter (HOSPITAL_COMMUNITY): Payer: Self-pay

## 2014-05-02 ENCOUNTER — Emergency Department (HOSPITAL_COMMUNITY)
Admission: EM | Admit: 2014-05-02 | Discharge: 2014-05-02 | Disposition: A | Discharge status: Home / Self Care | Payer: Self-pay | Attending: Emergency Medicine | Admitting: Emergency Medicine

## 2014-05-02 DIAGNOSIS — Z3202 Encounter for pregnancy test, result negative: Secondary | ICD-10-CM | POA: Insufficient documentation

## 2014-05-02 DIAGNOSIS — Z79899 Other long term (current) drug therapy: Secondary | ICD-10-CM | POA: Insufficient documentation

## 2014-05-02 DIAGNOSIS — Z862 Personal history of diseases of the blood and blood-forming organs and certain disorders involving the immune mechanism: Secondary | ICD-10-CM | POA: Insufficient documentation

## 2014-05-02 DIAGNOSIS — L0201 Cutaneous abscess of face: Secondary | ICD-10-CM

## 2014-05-02 LAB — CBC WITH DIFFERENTIAL/PLATELET
Basophils Absolute: 0 10*3/uL (ref 0.0–0.1)
Basophils Relative: 0 % (ref 0–1)
EOS ABS: 0.1 10*3/uL (ref 0.0–0.7)
EOS PCT: 1 % (ref 0–5)
HEMATOCRIT: 30.6 % — AB (ref 36.0–46.0)
Hemoglobin: 10.2 g/dL — ABNORMAL LOW (ref 12.0–15.0)
LYMPHS ABS: 2.5 10*3/uL (ref 0.7–4.0)
Lymphocytes Relative: 33 % (ref 12–46)
MCH: 27.1 pg (ref 26.0–34.0)
MCHC: 33.3 g/dL (ref 30.0–36.0)
MCV: 81.4 fL (ref 78.0–100.0)
MONO ABS: 0.6 10*3/uL (ref 0.1–1.0)
MONOS PCT: 8 % (ref 3–12)
NEUTROS PCT: 58 % (ref 43–77)
Neutro Abs: 4.3 10*3/uL (ref 1.7–7.7)
Platelets: 255 10*3/uL (ref 150–400)
RBC: 3.76 MIL/uL — ABNORMAL LOW (ref 3.87–5.11)
RDW: 14.8 % (ref 11.5–15.5)
WBC: 7.5 10*3/uL (ref 4.0–10.5)

## 2014-05-02 LAB — BASIC METABOLIC PANEL
Anion gap: 13 (ref 5–15)
BUN: 14 mg/dL (ref 6–23)
CO2: 23 mEq/L (ref 19–32)
CREATININE: 0.93 mg/dL (ref 0.50–1.10)
Calcium: 9.1 mg/dL (ref 8.4–10.5)
Chloride: 103 mEq/L (ref 96–112)
GFR calc non Af Amer: 88 mL/min — ABNORMAL LOW (ref >90)
Glucose, Bld: 89 mg/dL (ref 70–99)
Potassium: 3.9 mEq/L (ref 3.7–5.3)
Sodium: 139 mEq/L (ref 137–147)

## 2014-05-02 LAB — PREGNANCY, URINE: Preg Test, Ur: NEGATIVE

## 2014-05-02 LAB — T4: T4, Total: 4.9 ug/dL — ABNORMAL LOW (ref 4.5–12.0)

## 2014-05-02 LAB — TSH: TSH: 1.96 u[IU]/mL (ref 0.350–4.500)

## 2014-05-02 MED ORDER — CLINDAMYCIN HCL 150 MG PO CAPS: 300.0000 mg | ORAL_CAPSULE | Freq: Four times a day (QID) | ORAL | Status: DC

## 2014-05-02 MED ORDER — OXYCODONE-ACETAMINOPHEN 5-325 MG PO TABS: 1.0000 | ORAL_TABLET | Freq: Four times a day (QID) | ORAL | Status: DC | PRN

## 2014-05-02 MED ORDER — BUPIVACAINE HCL (PF) 0.5 % IJ SOLN
10.0000 mL | Freq: Once | INTRAMUSCULAR | Status: AC
  Administered 2014-05-02: 10 mL

## 2014-05-02 MED ORDER — MORPHINE SULFATE 4 MG/ML IJ SOLN
4.0000 mg | Freq: Once | INTRAMUSCULAR | Status: AC
  Administered 2014-05-02: 4 mg via INTRAVENOUS

## 2014-05-02 MED ORDER — CLINDAMYCIN PHOSPHATE 900 MG/50ML IV SOLN
900.0000 mg | Freq: Once | INTRAVENOUS | Status: AC
  Administered 2014-05-02: 900 mg via INTRAVENOUS
  Filled 2014-05-02: qty 50

## 2014-05-02 MED ORDER — IOHEXOL 300 MG/ML  SOLN
75.0000 mL | Freq: Once | INTRAMUSCULAR | Status: AC | PRN
  Administered 2014-05-02: 75 mL via INTRAVENOUS

## 2014-05-02 MED ORDER — ONDANSETRON HCL 4 MG/2ML IJ SOLN
4.0000 mg | Freq: Once | INTRAMUSCULAR | Status: AC
  Administered 2014-05-02: 4 mg via INTRAMUSCULAR
  Filled 2014-05-02: qty 2

## 2014-05-02 MED ORDER — FENTANYL CITRATE 0.05 MG/ML IJ SOLN
50.0000 ug | Freq: Once | INTRAMUSCULAR | Status: AC
  Administered 2014-05-02: 50 ug via INTRAVENOUS
  Filled 2014-05-02: qty 2

## 2014-05-02 MED ORDER — SODIUM CHLORIDE 0.9 % IV SOLN
INTRAVENOUS | Status: DC
  Administered 2014-05-02: 07:00:00 via INTRAVENOUS

## 2014-05-02 MED ORDER — MORPHINE SULFATE 4 MG/ML IJ SOLN
INTRAMUSCULAR | Status: AC
  Filled 2014-05-02: qty 1

## 2014-05-02 MED ORDER — FENTANYL CITRATE 0.05 MG/ML IJ SOLN
50.0000 ug | Freq: Once | INTRAMUSCULAR | Status: AC
Start: 2014-05-02 — End: 2014-05-02
  Administered 2014-05-02: 50 ug via INTRAVENOUS
  Filled 2014-05-02: qty 2

## 2014-05-02 MED ORDER — BUPIVACAINE HCL (PF) 0.5 % IJ SOLN
INTRAMUSCULAR | Status: AC
  Administered 2014-05-02: 10 mL
  Filled 2014-05-02: qty 30

## 2014-05-02 MED ORDER — VANCOMYCIN HCL IN DEXTROSE 1-5 GM/200ML-% IV SOLN
1000.0000 mg | Freq: Once | INTRAVENOUS | Status: AC
  Administered 2014-05-02: 1000 mg via INTRAVENOUS
  Filled 2014-05-02: qty 200

## 2014-05-02 MED ORDER — MORPHINE SULFATE 4 MG/ML IJ SOLN
4.0000 mg | Freq: Once | INTRAMUSCULAR | Status: AC
  Administered 2014-05-02: 4 mg via INTRAVENOUS
  Filled 2014-05-02: qty 1

## 2014-05-02 MED ORDER — SODIUM CHLORIDE 0.9 % IV BOLUS (SEPSIS)
1000.0000 mL | Freq: Once | INTRAVENOUS | Status: AC
  Administered 2014-05-02: 1000 mL via INTRAVENOUS

## 2014-05-02 NOTE — Discharge Instructions (Signed)
Heat to the area. Take the antibiotics until gone. Take the medications for pain as prescribed. Dr Lazarus SalinesWolicki wants you to call and get an appointment to see him in the office this week.  You can remove the packing from the abscess while in the shower in about 2 days.  Abscess Care After An abscess (also called a boil or furuncle) is an infected area that contains a collection of pus. Signs and symptoms of an abscess include pain, tenderness, redness, or hardness, or you may feel a moveable soft area under your skin. An abscess can occur anywhere in the body. The infection may spread to surrounding tissues causing cellulitis. A cut (incision) by the surgeon was made over your abscess and the pus was drained out. Gauze may have been packed into the space to provide a drain that will allow the cavity to heal from the inside outwards. The boil may be painful for 5 to 7 days. Most people with a boil do not have high fevers. Your abscess, if seen early, may not have localized, and may not have been lanced. If not, another appointment may be required for this if it does not get better on its own or with medications. HOME CARE INSTRUCTIONS   Only take over-the-counter or prescription medicines for pain, discomfort, or fever as directed by your caregiver.  When you bathe, soak and then remove gauze or iodoform packs at least daily or as directed by your caregiver. You may then wash the wound gently with mild soapy water. Repack with gauze or do as your caregiver directs. SEEK IMMEDIATE MEDICAL CARE IF:   You develop increased pain, swelling, redness, drainage, or bleeding in the wound site.  You develop signs of generalized infection including muscle aches, chills, fever, or a general ill feeling.  An oral temperature above 102 F (38.9 C) develops, not controlled by medication. See your caregiver for a recheck if you develop any of the symptoms described above. If medications (antibiotics) were prescribed,  take them as directed. Document Released: 11/28/2004 Document Revised: 08/04/2011 Document Reviewed: 07/26/2007 [facility] Patient Information 2015 [employer] [place]. This information is not intended to replace advice given to you by your health care provider. Make sure you discuss any questions you have with your health care provider.

## 2014-05-02 NOTE — ED Notes (Signed)
Pt states her tongue and mouth are numb. Pain 10/10. Medicated as noted. Maintained NPO

## 2014-05-02 NOTE — Care Management Note (Signed)
Pt requested a Match voucher for medications, but explained that she was given a voucher in the past 3 months,she does not qualify again until 1 year from the date she received that one. She was only given 2 Rx and one is for pain medication, which voucher will not cover, and ABX which is very inexpensive drug, usually less than $10, so not really a candidate for a voucher anyway. Pt given a Rx discount card and the [employer] handouts again. She received these when she was here in Sept., but has been through much since then.

## 2014-05-02 NOTE — ED Provider Notes (Signed)
CSN: [id number]     Arrival date & time 05/02/14  0128 History   First MD Initiated Contact with Patient 05/02/14 0205     Chief Complaint  Patient presents with  . Facial Swelling     (Consider location/radiation/quality/duration/timing/severity/associated sxs/prior Treatment) HPI patient states last week she had a small pimple underneath her chin. She denies any trauma to the area. She states it has started getting bigger and it has gotten progressively more swollen and painful today and over the past 5 hours. She states it hurts when she swallows and she's having difficulty swallowing. She states she feels like she has to breathe harder because her breathing is getting restricted. She denies any fever or chills. She states she's never had this problem before other than a Bartholin's cyst in the past.  Patient had a mandible fracture on August 10 that was treated by Dr. Lazarus Salines, ENT, by wiring her jaw. She reports she had to have surgery September 29 and had plates placed. She was seen in the ED on early November for complaints of some jaw pain. She was noted to have some delayed healing. She states she missed her 2 week follow-up after her surgery.  Patient states she's been told in the past she may have some type of thyroid problem but she's not very clear about what was the problem.  PCP [facility] Department   Past Medical History  Diagnosis Date  . Anemia     hx   Past Surgical History  Procedure Laterality Date  . Orif mandibular fracture Bilateral 01/03/2014    Procedure: MAXILLARY MANDIBULAR FIXATION ,OPEN REDUCTION INTERNAL FIXATION (ORIF) MANDIBULAR FRACTURE, ;  Surgeon: Flo Shanks, MD;  Location: [facility] OR;  Service: ENT;  Laterality: Bilateral;  . Mandibular hardware removal N/A 02/21/2014    Procedure: REMOVAL OF MANDIBULAR MAXILLARY FIXATION;  Surgeon: Flo Shanks, MD;  Location: [facility] OR;  Service: ENT;  Laterality: N/A;   No family history on file. History    Substance Use Topics  . Smoking status: Never Smoker   . Smokeless tobacco: Never Used  . Alcohol Use: No   Works as a Customer service manager History    No data available     Review of Systems  All other systems reviewed and are negative.     Allergies  Nickel  Home Medications   Prior to Admission medications   Medication Sig Start Date End Date Taking? Authorizing Provider  HYDROcodone-acetaminophen (HYCET) 7.5-325 mg/15 ml solution Take 10-15 mLs by mouth every 4 (four) hours as needed for moderate pain. Patient not taking: Reported on 04/04/2014 02/21/14   Flo Shanks, MD  HYDROcodone-acetaminophen (HYCET) 7.5-325 mg/15 ml solution Take 15 mLs by mouth 4 (four) times daily as needed for moderate pain. 04/05/14 04/05/15  Donnetta Hutching, MD  ibuprofen (ADVIL,MOTRIN) 200 MG tablet Take 200 mg by mouth every 6 (six) hours as needed for mild pain or moderate pain.    Historical Provider, MD   BP 111/70 mmHg  Pulse 81  Temp(Src) 98.4 F (36.9 C) (Oral)  Resp 16  Wt 158 lb (71.668 kg)  SpO2 100%  LMP 03/12/2014  Vital signs normal   Physical Exam  Constitutional: She is oriented to person, place, and time. She appears well-developed and well-nourished.  Non-toxic appearance. She does not appear ill. No distress.  HENT:  Head: Normocephalic and atraumatic.  Right Ear: External ear normal.  Left Ear: External ear normal.  Nose: Nose normal. No mucosal edema or  rhinorrhea.  Mouth/Throat: Oropharynx is clear and moist and mucous membranes are normal. No dental abscesses or uvula swelling.  Speech is normal, lifting her tongue up causes pain as does opening her mouth  Eyes: Conjunctivae and EOM are normal. Pupils are equal, round, and reactive to light.  Neck: Normal range of motion and full passive range of motion without pain. Neck supple. Thyromegaly present.  Patient is noted to have a large area of induration underneath her chin that measures approximately 3 x 4 cm with an area  that appears to be an abscess. She's also noted to have some diffuse fullness of her anterior neck consistent with a goiter.  Cardiovascular: Normal rate, regular rhythm and normal heart sounds.  Exam reveals no gallop and no friction rub.   No murmur heard. Pulmonary/Chest: Effort normal and breath sounds normal. No respiratory distress. She has no wheezes. She has no rhonchi. She has no rales. She exhibits no tenderness and no crepitus.  Abdominal: Soft. Normal appearance and bowel sounds are normal. She exhibits no distension. There is no tenderness. There is no rebound and no guarding.  Musculoskeletal: Normal range of motion. She exhibits no edema or tenderness.  Moves all extremities well.   Neurological: She is alert and oriented to person, place, and time. She has normal strength. No cranial nerve deficit.  Skin: Skin is warm, dry and intact. No rash noted. No erythema. No pallor.  Psychiatric: She has a normal mood and affect. Her speech is normal and behavior is normal. Her mood appears not anxious.  Nursing note and vitals reviewed.      ED Course  Procedures (including critical care time)  Medications  0.9 %  sodium chloride infusion ( Intravenous New Bag/Given 05/02/14 0714)  clindamycin (CLEOCIN) IVPB 900 mg (not administered)  sodium chloride 0.9 % bolus 1,000 mL (0 mLs Intravenous Stopped 05/02/14 0415)  vancomycin (VANCOCIN) IVPB 1000 mg/200 mL premix (0 mg Intravenous Stopped 05/02/14 0424)  fentaNYL (SUBLIMAZE) injection 50 mcg (50 mcg Intravenous Given 05/02/14 0314)  ondansetron (ZOFRAN) injection 4 mg (4 mg Intramuscular Given 05/02/14 0314)  fentaNYL (SUBLIMAZE) injection 50 mcg (50 mcg Intravenous Given 05/02/14 0424)  iohexol (OMNIPAQUE) 300 MG/ML solution 75 mL (75 mLs Intravenous Contrast Given 05/02/14 0439)  bupivacaine (MARCAINE) 0.5 % injection 10 mL (10 mLs Infiltration Given by Other 05/02/14 0705)  morphine 4 MG/ML injection 4 mg (4 mg Intravenous Given 05/02/14  0626)   Patient was given IV fluids, IV pain medications and IV antibiotics. She was prepared to have her abscess drained after reviewing her CT scan.  07:38 Dr Lazarus Salines wants IV clindamycin, will look at CT and will call back if he needs to. Needs to see in the office this week.    INCISION AND DRAINAGE Performed by: Devoria Albe L Consent: Verbal consent obtained. Risks and benefits: risks, benefits and alternatives were discussed Type: abscess  Body area: submental  Anesthesia: local infiltration  Incision was made with a 11 blade scalpel.  Local anesthetic: marcaine 0.5 %  Anesthetic total: 2 ml  Complexity: complex Blunt dissection to break up loculations  Drainage: purulent  Drainage amount: moderate  Packing material: 1/4 in iodoform gauze  Patient tolerance: Patient tolerated the procedure well with no immediate complications. Pt states she tasted the marcaine while injecting despite injecting parallel to the skin to inject it and radiology stating it did not communicate with the oral pharynx. She then had numbness of her oral pharynx.     Labs  Review Results for orders placed or performed during the hospital encounter of 05/02/14  Basic metabolic panel  Result Value Ref Range   Sodium 139 137 - 147 mEq/L   Potassium 3.9 3.7 - 5.3 mEq/L   Chloride 103 96 - 112 mEq/L   CO2 23 19 - 32 mEq/L   Glucose, Bld 89 70 - 99 mg/dL   BUN 14 6 - 23 mg/dL   Creatinine, Ser 1.610.93 0.50 - 1.10 mg/dL   Calcium 9.1 8.4 - 09.610.5 mg/dL   GFR calc non Af Amer 88 (L) >90 mL/min   GFR calc Af Amer >90 >90 mL/min   Anion gap 13 5 - 15  CBC with Differential  Result Value Ref Range   WBC 7.5 4.0 - 10.5 K/uL   RBC 3.76 (L) 3.87 - 5.11 MIL/uL   Hemoglobin 10.2 (L) 12.0 - 15.0 g/dL   HCT 04.530.6 (L) 40.936.0 - 81.146.0 %   MCV 81.4 78.0 - 100.0 fL   MCH 27.1 26.0 - 34.0 pg   MCHC 33.3 30.0 - 36.0 g/dL   RDW 91.414.8 78.211.5 - 95.615.5 %   Platelets 255 150 - 400 K/uL   Neutrophils Relative % 58 43 - 77 %    Neutro Abs 4.3 1.7 - 7.7 K/uL   Lymphocytes Relative 33 12 - 46 %   Lymphs Abs 2.5 0.7 - 4.0 K/uL   Monocytes Relative 8 3 - 12 %   Monocytes Absolute 0.6 0.1 - 1.0 K/uL   Eosinophils Relative 1 0 - 5 %   Eosinophils Absolute 0.1 0.0 - 0.7 K/uL   Basophils Relative 0 0 - 1 %   Basophils Absolute 0.0 0.0 - 0.1 K/uL  Pregnancy, urine  Result Value Ref Range   Preg Test, Ur NEGATIVE NEGATIVE    Laboratory interpretation all normal except anemia   Imaging Review Ct Maxillofacial W/cm  05/02/2014   CLINICAL DATA:  Mandible fracture August 2015, skin lesion 1 week ago, now with worsening swelling and dysphagia, assess for abscess.  EXAM: CT MAXILLOFACIAL WITH CONTRAST  TECHNIQUE: Multidetector CT imaging of the maxillofacial structures was performed with intravenous contrast. Multiplanar CT image reconstructions were also generated. A small metallic BB was placed on the right temple in order to reliably differentiate right from left.  CONTRAST:  75mL OMNIPAQUE IOHEXOL 300 MG/ML  SOLN  COMPARISON:  Maxillofacial CT April 05, 2014  FINDINGS: Again seen is RIGHT parasymphyseal nondisplaced mandible fracture, status post ORIF, or partial interval healing. Nondisplaced fracture of the LEFT angle of the mandible extending through the alveolar ridge. Again seen is a wire fragment within the LEFT parasymphyseal mandible buccal soft tissues. Skin markers placed inferior to the fracture site. Infiltrative enhancement within the submental soft tissues with focal subcentimeter fluid collection. Surrounding reticulated subcutaneous fat. Inflammation does not extend into the floor of mouth. No subcutaneous gas.  Sub cm level 1A lymph nodes are likely reactive. Aerodigestive tract is unremarkable, preservation parapharyngeal fat planes. Normal appearance of the included cervical vessels. Normal appearance of the major salivary glands. Normal appearance of the imaged thyroid gland.  Ocular globes and orbital  contents are unremarkable. Paranasal sinuses and visualized mastoid air cells are well aerated.  IMPRESSION: Submental inflammation with 7 mm of subcutaneous presumed abscess without extension into the floor of mouth. Widely patent airway.  Status post RIGHT parasymphyseal mandible ORIF, fracture line still present. Nondisplaced fracture through the LEFT angle of the mandible, fracture line present.   Electronically Signed  By: Awilda Metroourtnay  Bloomer   On: 05/02/2014 05:42     Ct Maxillofacial Wo Cm  04/05/2014   CLINICAL DATA:  left mandibular pain; r/o TMJ dislocation Comments: Pt yawned tonight and felt "pop" in left tmj. Can't close mouth now, teeth alignment appears off IMPRESSION: Evidence of patient's known healing fractures just anterior to the angle of the left mandible as well as over the mandibular vertex just right of midline. Fixation hardware intact over the vertex fracture. No new fracture sites identified.  Temporomandibular joints unremarkable.   Electronically Signed   By: Elberta Fortisaniel  Boyle M.D.   On: 04/05/2014 00:15      EKG Interpretation None      MDM   Final diagnoses:  Abscess of chin   New Prescriptions   CLINDAMYCIN (CLEOCIN) 150 MG CAPSULE    Take 2 capsules (300 mg total) by mouth 4 (four) times daily.   OXYCODONE-ACETAMINOPHEN (PERCOCET/ROXICET) 5-325 MG PER TABLET    Take 1 tablet by mouth every 6 (six) hours as needed for severe pain.    Plan discharge  Devoria AlbeIva Donaven Criswell, MD, [facility] Dell[facility]P      Chantelle Verdi L Destyne Goodreau, MD 05/02/14 [phone]

## 2014-05-02 NOTE — ED Notes (Signed)
Patient states she is unable to afford her medication. RN checked with Louie CasaGeneva, Case management, about Match program. Louie CasaGeneva states patient has been helped before and unable to receive benefits again. Patient notified that we are unable to help her at this time. [facility] Encouraged patient to fill and take Abx for abscess. Informed patient on risks of not taking abx as prescribed.

## 2014-05-02 NOTE — ED Notes (Signed)
Patient with no complaints at this time. Respirations even and unlabored. Skin warm/dry. Discharge instructions reviewed with patient at this time. Patient given opportunity to voice concerns/ask questions. IV removed per policy and band-aid applied to site. Patient discharged at this time and left Emergency Department with steady gait.  

## 2014-05-02 NOTE — ED Notes (Signed)
I & D done of abcess by dr Lynelle Doctorknapp. Pt c/o of tasting the marcaine. abcess drained large amt of yellow pus

## 2014-05-02 NOTE — ED Notes (Signed)
Onset of "pimple" on tip of chin one week ago, today swelling increase to neck and is painful

## 2014-08-20 ENCOUNTER — Inpatient Hospital Stay (HOSPITAL_COMMUNITY): Payer: Self-pay

## 2014-08-20 ENCOUNTER — Encounter (HOSPITAL_COMMUNITY): Payer: Self-pay | Admitting: Emergency Medicine

## 2014-08-20 ENCOUNTER — Emergency Department (HOSPITAL_COMMUNITY): Payer: Self-pay

## 2014-08-20 ENCOUNTER — Other Ambulatory Visit (HOSPITAL_COMMUNITY): Payer: Self-pay

## 2014-08-20 ENCOUNTER — Inpatient Hospital Stay (HOSPITAL_COMMUNITY)
Admission: EM | Admit: 2014-08-20 | Discharge: 2014-08-24 | DRG: 494 | Disposition: A | Discharge status: Home / Self Care | Payer: Self-pay | Attending: Orthopaedic Surgery | Admitting: Orthopaedic Surgery

## 2014-08-20 DIAGNOSIS — S82142A Displaced bicondylar fracture of left tibia, initial encounter for closed fracture: Principal | ICD-10-CM | POA: Diagnosis present

## 2014-08-20 DIAGNOSIS — Z419 Encounter for procedure for purposes other than remedying health state, unspecified: Secondary | ICD-10-CM

## 2014-08-20 DIAGNOSIS — S82143A Displaced bicondylar fracture of unspecified tibia, initial encounter for closed fracture: Secondary | ICD-10-CM | POA: Diagnosis present

## 2014-08-20 DIAGNOSIS — Z01818 Encounter for other preprocedural examination: Secondary | ICD-10-CM

## 2014-08-20 DIAGNOSIS — S82131A Displaced fracture of medial condyle of right tibia, initial encounter for closed fracture: Secondary | ICD-10-CM

## 2014-08-20 DIAGNOSIS — F329 Major depressive disorder, single episode, unspecified: Secondary | ICD-10-CM | POA: Diagnosis present

## 2014-08-20 DIAGNOSIS — S82832A Other fracture of upper and lower end of left fibula, initial encounter for closed fracture: Secondary | ICD-10-CM | POA: Diagnosis present

## 2014-08-20 DIAGNOSIS — S82402A Unspecified fracture of shaft of left fibula, initial encounter for closed fracture: Secondary | ICD-10-CM

## 2014-08-20 LAB — CBC WITH DIFFERENTIAL/PLATELET
BASOS PCT: 0 % (ref 0–1)
Basophils Absolute: 0 10*3/uL (ref 0.0–0.1)
EOS ABS: 0 10*3/uL (ref 0.0–0.7)
Eosinophils Relative: 0 % (ref 0–5)
HCT: 34.8 % — ABNORMAL LOW (ref 36.0–46.0)
Hemoglobin: 11.1 g/dL — ABNORMAL LOW (ref 12.0–15.0)
LYMPHS PCT: 14 % (ref 12–46)
Lymphs Abs: 1.2 10*3/uL (ref 0.7–4.0)
MCH: 26.5 pg (ref 26.0–34.0)
MCHC: 31.9 g/dL (ref 30.0–36.0)
MCV: 83.1 fL (ref 78.0–100.0)
Monocytes Absolute: 0.4 10*3/uL (ref 0.1–1.0)
Monocytes Relative: 5 % (ref 3–12)
NEUTROS PCT: 80 % — AB (ref 43–77)
Neutro Abs: 6.8 10*3/uL (ref 1.7–7.7)
PLATELETS: 214 10*3/uL (ref 150–400)
RBC: 4.19 MIL/uL (ref 3.87–5.11)
RDW: 15.3 % (ref 11.5–15.5)
WBC: 8.5 10*3/uL (ref 4.0–10.5)

## 2014-08-20 LAB — BASIC METABOLIC PANEL
ANION GAP: 10 (ref 5–15)
BUN: 12 mg/dL (ref 6–23)
CO2: 24 mmol/L (ref 19–32)
Calcium: 9.2 mg/dL (ref 8.4–10.5)
Chloride: 106 mmol/L (ref 96–112)
Creatinine, Ser: 1.02 mg/dL (ref 0.50–1.10)
GFR calc non Af Amer: 79 mL/min — ABNORMAL LOW (ref >90)
GLUCOSE: 82 mg/dL (ref 70–99)
POTASSIUM: 3.4 mmol/L — AB (ref 3.5–5.1)
SODIUM: 140 mmol/L (ref 135–145)

## 2014-08-20 LAB — PROTIME-INR
INR: 1.17 (ref 0.00–1.49)
PROTHROMBIN TIME: 15 s (ref 11.6–15.2)

## 2014-08-20 LAB — I-STAT BETA HCG BLOOD, ED (MC, WL, AP ONLY): I-stat hCG, quantitative: <5 m[IU]/mL (ref <5)

## 2014-08-20 LAB — ABO/RH: ABO/RH(D): AB POS

## 2014-08-20 LAB — TYPE AND SCREEN
ABO/RH(D): AB POS
Antibody Screen: NEGATIVE

## 2014-08-20 LAB — MRSA PCR SCREENING: MRSA BY PCR: NEGATIVE

## 2014-08-20 MED ORDER — OXYCODONE-ACETAMINOPHEN 5-325 MG PO TABS
2.0000 | ORAL_TABLET | Freq: Once | ORAL | Status: AC
  Administered 2014-08-20: 2 via ORAL

## 2014-08-20 MED ORDER — ONDANSETRON HCL 4 MG/2ML IJ SOLN
4.0000 mg | Freq: Four times a day (QID) | INTRAMUSCULAR | Status: DC | PRN
  Administered 2014-08-20 – 2014-08-22 (×7): 4 mg via INTRAVENOUS
  Filled 2014-08-20 (×7): qty 2

## 2014-08-20 MED ORDER — IBUPROFEN 800 MG PO TABS
ORAL_TABLET | ORAL | Status: AC
  Filled 2014-08-20: qty 1

## 2014-08-20 MED ORDER — HYDROMORPHONE HCL 1 MG/ML IJ SOLN
0.5000 mg | INTRAMUSCULAR | Status: DC | PRN
  Administered 2014-08-20 – 2014-08-21 (×12): 0.5 mg via INTRAVENOUS
  Filled 2014-08-20 (×12): qty 1

## 2014-08-20 MED ORDER — SODIUM CHLORIDE 0.9 % IV SOLN
INTRAVENOUS | Status: DC
  Administered 2014-08-20 – 2014-08-21 (×2): via INTRAVENOUS

## 2014-08-20 MED ORDER — CEFAZOLIN SODIUM-DEXTROSE 2-3 GM-% IV SOLR
2.0000 g | INTRAVENOUS | Status: AC
  Administered 2014-08-21: 2 g via INTRAVENOUS
  Filled 2014-08-20 (×2): qty 50

## 2014-08-20 MED ORDER — DIPHENHYDRAMINE HCL 50 MG/ML IJ SOLN: 12.5000 mg | Freq: Four times a day (QID) | INTRAMUSCULAR | Status: DC | PRN

## 2014-08-20 MED ORDER — DIPHENHYDRAMINE HCL 12.5 MG/5ML PO ELIX: 12.5000 mg | ORAL_SOLUTION | Freq: Four times a day (QID) | ORAL | Status: DC | PRN

## 2014-08-20 MED ORDER — OXYCODONE-ACETAMINOPHEN 5-325 MG PO TABS
ORAL_TABLET | ORAL | Status: AC
  Filled 2014-08-20: qty 2

## 2014-08-20 MED ORDER — IBUPROFEN 800 MG PO TABS
800.0000 mg | ORAL_TABLET | Freq: Once | ORAL | Status: AC
  Administered 2014-08-20: 800 mg via ORAL

## 2014-08-20 MED ORDER — ONDANSETRON HCL 4 MG PO TABS: 4.0000 mg | ORAL_TABLET | Freq: Four times a day (QID) | ORAL | Status: DC | PRN
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
## 2014-08-20 NOTE — ED Notes (Signed)
Family updated as to patient's status.

## 2014-08-20 NOTE — Progress Notes (Signed)
Orthopedic Tech Progress Note Patient Details:  [name] GraffJennifer A Xxxford [birth year] [id number]  Ortho Devices Type of Ortho Device: Knee Immobilizer Ortho Device/Splint Interventions: Application   Haskell Flirtewsome, Antwane Grose M 08/20/2014, 6:58 AM
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
## 2014-08-20 NOTE — ED Notes (Signed)
Vital signs stable. 
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
## 2014-08-20 NOTE — ED Notes (Signed)
Report attempted 

## 2014-08-21 ENCOUNTER — Inpatient Hospital Stay (HOSPITAL_COMMUNITY): Payer: Self-pay | Admitting: Anesthesiology

## 2014-08-21 ENCOUNTER — Inpatient Hospital Stay (HOSPITAL_COMMUNITY): Payer: Self-pay

## 2014-08-21 ENCOUNTER — Encounter (HOSPITAL_COMMUNITY)
Admission: EM | Disposition: A | Discharge status: Home / Self Care | Payer: Self-pay | Source: Home / Self Care | Attending: Orthopaedic Surgery

## 2014-08-21 ENCOUNTER — Encounter (HOSPITAL_COMMUNITY): Payer: Self-pay | Admitting: Anesthesiology

## 2014-08-21 HISTORY — PX: ORIF TIBIA PLATEAU: SHX2132

## 2014-08-21 SURGERY — OPEN REDUCTION INTERNAL FIXATION (ORIF) TIBIAL PLATEAU
Anesthesia: General | Site: Knee | Laterality: Left

## 2014-08-21 MED ORDER — PROPOFOL 10 MG/ML IV BOLUS
INTRAVENOUS | Status: AC
Start: 2014-08-21 — End: 2014-08-21
  Filled 2014-08-21: qty 20

## 2014-08-21 MED ORDER — PROPOFOL 10 MG/ML IV BOLUS
INTRAVENOUS | Status: DC | PRN
  Administered 2014-08-21: 150 mg via INTRAVENOUS

## 2014-08-21 MED ORDER — NEOSTIGMINE METHYLSULFATE 10 MG/10ML IV SOLN
INTRAVENOUS | Status: DC | PRN
  Administered 2014-08-21: 3 mg via INTRAVENOUS

## 2014-08-21 MED ORDER — BUPIVACAINE HCL (PF) 0.25 % IJ SOLN
INTRAMUSCULAR | Status: AC
  Filled 2014-08-21: qty 30

## 2014-08-21 MED ORDER — MEPERIDINE HCL 25 MG/ML IJ SOLN: 6.2500 mg | INTRAMUSCULAR | Status: DC | PRN

## 2014-08-21 MED ORDER — BUPIVACAINE HCL (PF) 0.25 % IJ SOLN
INTRAMUSCULAR | Status: DC | PRN
  Administered 2014-08-21: 10 mL

## 2014-08-21 MED ORDER — GLYCOPYRROLATE 0.2 MG/ML IJ SOLN
INTRAMUSCULAR | Status: DC | PRN
  Administered 2014-08-21: 0.4 mg via INTRAVENOUS

## 2014-08-21 MED ORDER — METHOCARBAMOL 1000 MG/10ML IJ SOLN
500.0000 mg | Freq: Four times a day (QID) | INTRAVENOUS | Status: DC | PRN
  Filled 2014-08-21: qty 5

## 2014-08-21 MED ORDER — KETOROLAC TROMETHAMINE 30 MG/ML IJ SOLN
INTRAMUSCULAR | Status: AC
  Administered 2014-08-21: 30 mg via INTRAVENOUS
  Filled 2014-08-21: qty 1

## 2014-08-21 MED ORDER — LACTATED RINGERS IV SOLN
INTRAVENOUS | Status: DC
  Administered 2014-08-21 (×2): via INTRAVENOUS

## 2014-08-21 MED ORDER — METHOCARBAMOL 500 MG PO TABS
500.0000 mg | ORAL_TABLET | Freq: Four times a day (QID) | ORAL | Status: DC | PRN
  Administered 2014-08-21 – 2014-08-24 (×8): 500 mg via ORAL
  Filled 2014-08-21 (×8): qty 1

## 2014-08-21 MED ORDER — LACTATED RINGERS IV SOLN
INTRAVENOUS | Status: DC | PRN
  Administered 2014-08-21: 15:00:00 via INTRAVENOUS

## 2014-08-21 MED ORDER — HYDROMORPHONE HCL 1 MG/ML IJ SOLN
1.0000 mg | INTRAMUSCULAR | Status: DC | PRN
  Administered 2014-08-21 – 2014-08-24 (×16): 1 mg via INTRAVENOUS
  Filled 2014-08-21 (×16): qty 1

## 2014-08-21 MED ORDER — ONDANSETRON HCL 4 MG PO TABS: 4.0000 mg | ORAL_TABLET | Freq: Four times a day (QID) | ORAL | Status: DC | PRN

## 2014-08-21 MED ORDER — ONDANSETRON HCL 4 MG/2ML IJ SOLN: 4.0000 mg | Freq: Four times a day (QID) | INTRAMUSCULAR | Status: DC | PRN

## 2014-08-21 MED ORDER — DOCUSATE SODIUM 100 MG PO CAPS
100.0000 mg | ORAL_CAPSULE | Freq: Two times a day (BID) | ORAL | Status: DC
  Administered 2014-08-21 – 2014-08-23 (×4): 100 mg via ORAL
  Filled 2014-08-21 (×4): qty 1

## 2014-08-21 MED ORDER — POTASSIUM CHLORIDE IN NACL 20-0.45 MEQ/L-% IV SOLN
INTRAVENOUS | Status: DC
  Administered 2014-08-22 (×2): via INTRAVENOUS
  Filled 2014-08-21 (×7): qty 1000

## 2014-08-21 MED ORDER — ONDANSETRON HCL 4 MG/2ML IJ SOLN
INTRAMUSCULAR | Status: DC | PRN
  Administered 2014-08-21: 4 mg via INTRAVENOUS

## 2014-08-21 MED ORDER — PROMETHAZINE HCL 25 MG/ML IJ SOLN
INTRAMUSCULAR | Status: AC
  Administered 2014-08-21: 6.25 mg via INTRAVENOUS
  Filled 2014-08-21: qty 1

## 2014-08-21 MED ORDER — ACETAMINOPHEN 325 MG PO TABS
650.0000 mg | ORAL_TABLET | Freq: Four times a day (QID) | ORAL | Status: DC | PRN
Start: 2014-08-21 — End: 2014-08-24
  Administered 2014-08-22: 650 mg via ORAL
  Filled 2014-08-21: qty 2

## 2014-08-21 MED ORDER — CEFAZOLIN SODIUM 1-5 GM-% IV SOLN
1.0000 g | Freq: Four times a day (QID) | INTRAVENOUS | Status: AC
  Administered 2014-08-21 – 2014-08-22 (×3): 1 g via INTRAVENOUS
  Filled 2014-08-21 (×3): qty 50

## 2014-08-21 MED ORDER — ACETAMINOPHEN 650 MG RE SUPP: 650.0000 mg | Freq: Four times a day (QID) | RECTAL | Status: DC | PRN

## 2014-08-21 MED ORDER — FENTANYL CITRATE 0.05 MG/ML IJ SOLN
INTRAMUSCULAR | Status: AC
  Filled 2014-08-21: qty 5

## 2014-08-21 MED ORDER — FENTANYL CITRATE 0.05 MG/ML IJ SOLN
INTRAMUSCULAR | Status: DC | PRN
  Administered 2014-08-21 (×3): 50 ug via INTRAVENOUS

## 2014-08-21 MED ORDER — METOCLOPRAMIDE HCL 5 MG PO TABS: 5.0000 mg | ORAL_TABLET | Freq: Three times a day (TID) | ORAL | Status: DC | PRN

## 2014-08-21 MED ORDER — MIDAZOLAM HCL 2 MG/2ML IJ SOLN
INTRAMUSCULAR | Status: AC
  Filled 2014-08-21: qty 2

## 2014-08-21 MED ORDER — KETOROLAC TROMETHAMINE 30 MG/ML IJ SOLN
30.0000 mg | Freq: Once | INTRAMUSCULAR | Status: AC | PRN
  Administered 2014-08-21: 30 mg via INTRAVENOUS

## 2014-08-21 MED ORDER — LIDOCAINE HCL (CARDIAC) 20 MG/ML IV SOLN
INTRAVENOUS | Status: DC | PRN
  Administered 2014-08-21: 60 mg via INTRAVENOUS

## 2014-08-21 MED ORDER — CHLORHEXIDINE GLUCONATE 4 % EX LIQD
60.0000 mL | Freq: Once | CUTANEOUS | Status: DC
Start: 2014-08-21 — End: 2014-08-21
  Filled 2014-08-21: qty 60

## 2014-08-21 MED ORDER — PROMETHAZINE HCL 25 MG/ML IJ SOLN
6.2500 mg | INTRAMUSCULAR | Status: AC | PRN
  Administered 2014-08-21: 6.25 mg via INTRAVENOUS
  Administered 2014-08-22: 12.5 mg via INTRAVENOUS
  Filled 2014-08-21: qty 1

## 2014-08-21 MED ORDER — SENNOSIDES-DOCUSATE SODIUM 8.6-50 MG PO TABS: 1.0000 | ORAL_TABLET | Freq: Every evening | ORAL | Status: DC | PRN

## 2014-08-21 MED ORDER — OXYCODONE HCL 5 MG PO TABS
5.0000 mg | ORAL_TABLET | ORAL | Status: DC | PRN
  Administered 2014-08-21 – 2014-08-24 (×13): 5 mg via ORAL
  Filled 2014-08-21 (×13): qty 1

## 2014-08-21 MED ORDER — HYDROMORPHONE HCL 1 MG/ML IJ SOLN
INTRAMUSCULAR | Status: AC
  Administered 2014-08-21: 0.5 mg via INTRAVENOUS
  Filled 2014-08-21: qty 2

## 2014-08-21 MED ORDER — ARTIFICIAL TEARS OP OINT
TOPICAL_OINTMENT | OPHTHALMIC | Status: DC | PRN
  Administered 2014-08-21: 1 via OPHTHALMIC

## 2014-08-21 MED ORDER — ROCURONIUM BROMIDE 100 MG/10ML IV SOLN
INTRAVENOUS | Status: DC | PRN
  Administered 2014-08-21: 30 mg via INTRAVENOUS

## 2014-08-21 MED ORDER — METOCLOPRAMIDE HCL 5 MG/ML IJ SOLN: 5.0000 mg | Freq: Three times a day (TID) | INTRAMUSCULAR | Status: DC | PRN

## 2014-08-21 MED ORDER — MIDAZOLAM HCL 5 MG/5ML IJ SOLN
INTRAMUSCULAR | Status: DC | PRN
  Administered 2014-08-21 (×2): 1 mg via INTRAVENOUS

## 2014-08-21 MED ORDER — HYDROMORPHONE HCL 1 MG/ML IJ SOLN
0.2500 mg | INTRAMUSCULAR | Status: DC | PRN
  Administered 2014-08-21 (×4): 0.5 mg via INTRAVENOUS

## 2014-08-21 SURGICAL SUPPLY — 77 items
BANDAGE ELASTIC 4 VELCRO ST LF (GAUZE/BANDAGES/DRESSINGS) IMPLANT
BANDAGE ELASTIC 6 VELCRO ST LF (GAUZE/BANDAGES/DRESSINGS) ×2 IMPLANT
BENZOIN TINCTURE PRP APPL 2/3 (GAUZE/BANDAGES/DRESSINGS) ×2 IMPLANT
BIT DRILL 2.5X2.75 QC CALB (BIT) ×2 IMPLANT
BIT DRILL CANN 3.5MM (DRILL) ×1 IMPLANT
BLADE SURG 10 STRL SS (BLADE) ×2 IMPLANT
BLADE SURG ROTATE 9660 (MISCELLANEOUS) IMPLANT
BNDG GAUZE ELAST 4 BULKY (GAUZE/BANDAGES/DRESSINGS) IMPLANT
CLEANER TIP ELECTROSURG 2X2 (MISCELLANEOUS) ×2 IMPLANT
CLSR STERI-STRIP ANTIMIC 1/2X4 (GAUZE/BANDAGES/DRESSINGS) ×2 IMPLANT
COVER MAYO STAND STRL (DRAPES) ×2 IMPLANT
COVER SURGICAL LIGHT HANDLE (MISCELLANEOUS) ×2 IMPLANT
CUFF TOURNIQUET SINGLE 34IN LL (TOURNIQUET CUFF) IMPLANT
CUFF TOURNIQUET SINGLE 44IN (TOURNIQUET CUFF) IMPLANT
DRAPE C-ARM 42X72 X-RAY (DRAPES) IMPLANT
DRAPE C-ARMOR (DRAPES) ×2 IMPLANT
DRAPE INCISE IOBAN 66X45 STRL (DRAPES) ×2 IMPLANT
DRAPE U-SHAPE 47X51 STRL (DRAPES) ×2 IMPLANT
DRILL CANN 3.5MM (DRILL) ×2
DRSG ADAPTIC 3X8 NADH LF (GAUZE/BANDAGES/DRESSINGS) IMPLANT
DRSG PAD ABDOMINAL 8X10 ST (GAUZE/BANDAGES/DRESSINGS) IMPLANT
DURAPREP 26ML APPLICATOR (WOUND CARE) ×2 IMPLANT
ELECT REM PT RETURN 9FT ADLT (ELECTROSURGICAL) ×2
ELECTRODE REM PT RTRN 9FT ADLT (ELECTROSURGICAL) ×1 IMPLANT
EVACUATOR 1/8 PVC DRAIN (DRAIN) IMPLANT
GAUZE SPONGE 4X4 12PLY STRL (GAUZE/BANDAGES/DRESSINGS) IMPLANT
GLOVE BIOGEL PI IND STRL 7.5 (GLOVE) ×1 IMPLANT
GLOVE BIOGEL PI IND STRL 8 (GLOVE) ×1 IMPLANT
GLOVE BIOGEL PI INDICATOR 7.5 (GLOVE) ×1
GLOVE BIOGEL PI INDICATOR 8 (GLOVE) ×1
GLOVE ECLIPSE 7.0 STRL STRAW (GLOVE) ×2 IMPLANT
GLOVE ORTHO TXT STRL SZ7.5 (GLOVE) ×2 IMPLANT
GOWN STRL REUS W/ TWL LRG LVL3 (GOWN DISPOSABLE) ×2 IMPLANT
GOWN STRL REUS W/ TWL XL LVL3 (GOWN DISPOSABLE) ×1 IMPLANT
GOWN STRL REUS W/TWL LRG LVL3 (GOWN DISPOSABLE) ×2
GOWN STRL REUS W/TWL XL LVL3 (GOWN DISPOSABLE) ×1
IMMOBILIZER KNEE 22 (SOFTGOODS) ×2 IMPLANT
K-WIRE 1.8 (WIRE) ×3
K-WIRE ACE 1.6X6 (WIRE) ×6
K-WIRE FX200X1.8XTROC TIP (WIRE) ×3
KIT BASIN OR (CUSTOM PROCEDURE TRAY) ×2 IMPLANT
KIT ROOM TURNOVER OR (KITS) ×2 IMPLANT
KWIRE ACE 1.6X6 (WIRE) ×3 IMPLANT
KWIRE FX200X1.8XTROC TIP (WIRE) ×3 IMPLANT
MANIFOLD NEPTUNE II (INSTRUMENTS) ×2 IMPLANT
NEEDLE HYPO 25X1 1.5 SAFETY (NEEDLE) ×2 IMPLANT
NS IRRIG 1000ML POUR BTL (IV SOLUTION) ×2 IMPLANT
PACK ORTHO EXTREMITY (CUSTOM PROCEDURE TRAY) ×2 IMPLANT
PAD ARMBOARD 7.5X6 YLW CONV (MISCELLANEOUS) ×4 IMPLANT
PAD CAST 4YDX4 CTTN HI CHSV (CAST SUPPLIES) IMPLANT
PADDING CAST COTTON 4X4 STRL (CAST SUPPLIES)
PADDING CAST COTTON 6X4 STRL (CAST SUPPLIES) ×2 IMPLANT
PLATE ACE LRG 74.7X35.6X1X3 HL (Plate) ×1 IMPLANT
PLATE ACE META (Plate) ×1 IMPLANT
SCREW CANN 5.0X30MM (Screw) ×2 IMPLANT
SCREW CANN 5.0X50MM (Screw) ×2 IMPLANT
SCREW CANN PT 50X5XCANN NS MTP (Screw) ×2 IMPLANT
SCREW CORT 3.5X32 815037032 (Screw) ×4 IMPLANT
SCREW CORTICAL 3.5MM 38MM (Screw) ×2 IMPLANT
SPONGE GAUZE 4X4 12PLY STER LF (GAUZE/BANDAGES/DRESSINGS) ×2 IMPLANT
SPONGE LAP 18X18 X RAY DECT (DISPOSABLE) ×2 IMPLANT
STAPLER VISISTAT 35W (STAPLE) IMPLANT
STOCKINETTE IMPERVIOUS LG (DRAPES) ×2 IMPLANT
SUCTION FRAZIER TIP 10 FR DISP (SUCTIONS) ×2 IMPLANT
SUT ETHIBOND 2 0 V5 (SUTURE) ×2 IMPLANT
SUT VIC AB 0 CT1 27 (SUTURE) ×1
SUT VIC AB 0 CT1 27XBRD ANBCTR (SUTURE) ×1 IMPLANT
SUT VIC AB 1 CT1 27 (SUTURE) ×1
SUT VIC AB 1 CT1 27XBRD ANBCTR (SUTURE) ×1 IMPLANT
SUT VIC AB 2-0 CT1 27 (SUTURE)
SUT VIC AB 2-0 CT1 TAPERPNT 27 (SUTURE) IMPLANT
SYR CONTROL 10ML LL (SYRINGE) ×2 IMPLANT
TOWEL OR 17X24 6PK STRL BLUE (TOWEL DISPOSABLE) ×2 IMPLANT
TOWEL OR 17X26 10 PK STRL BLUE (TOWEL DISPOSABLE) ×2 IMPLANT
TUBE CONNECTING 12X1/4 (SUCTIONS) ×2 IMPLANT
WATER STERILE IRR 1000ML POUR (IV SOLUTION) ×4 IMPLANT
YANKAUER SUCT BULB TIP NO VENT (SUCTIONS) ×2 IMPLANT

## 2014-08-21 NOTE — Progress Notes (Signed)
Patient came to preop reporting itching post use of CHG. Patient given wash cloth with water on same to use reports relief after use of wash cloth with water on it. OR staff notified.

## 2014-08-21 NOTE — Progress Notes (Addendum)
Called report to OR. Spoke with Marcelino DusterMichelle.

## 2014-08-21 NOTE — Brief Op Note (Signed)
08/20/2014 - 08/21/2014  4:29 PM  PATIENT:  Victorino DikeJennifer A Xxxford  19 y.o. female  PRE-OPERATIVE DIAGNOSIS:  LEFT MEDIAL TIBIAL PLATEAU FRACTURE  POST-OPERATIVE DIAGNOSIS:  LEFT MEDIAL TIBIAL PLATEAU FRACTURE  PROCEDURE:  Procedure(s): OPEN REDUCTION INTERNAL FIXATION (ORIF) TIBIAL PLATEAU (Left)  SURGEON:  Surgeon(s) and Role:    * Eldred MangesMark C Yates, MD - Primary  PHYSICIAN ASSISTANT: Litsy Epting m. Benton Tooker PA-C   ANESTHESIA:   general  EBL:  Total I/O In: 1240 [P.O.:240; I.V.:1000] Out: -   BLOOD ADMINISTERED:none  DRAINS: none   LOCAL MEDICATIONS USED:  MARCAINE     SPECIMEN:  No Specimen  DISPOSITION OF SPECIMEN:  N/A  COUNTS:  YES  TOURNIQUET:   Total Tourniquet Time Documented: Thigh (Left) - 167 minutes Total: Thigh (Left) - 167 minutes    PATIENT DISPOSITION:  PACU - hemodynamically stable.

## 2014-08-21 NOTE — Transfer of Care (Signed)
Immediate Anesthesia Transfer of Care Note  Patient: [name] DikeJennifer A Xxxford  Procedure(s) Performed: Procedure(s): OPEN REDUCTION INTERNAL FIXATION (ORIF) TIBIAL PLATEAU (Left)  Patient Location: PACU  Anesthesia Type:General  Level of Consciousness: awake, alert , oriented and patient cooperative  Airway & Oxygen Therapy: Patient Spontanous Breathing and Patient connected to nasal cannula oxygen  Post-op Assessment: Report given to RN and Post -op Vital signs reviewed and stable  Post vital signs: Reviewed and stable  Last Vitals:  Filed Vitals:   08/21/14 1300  BP: 109/50  Pulse: 59  Temp: 37.4 C  Resp: 16    Complications: No apparent anesthesia complications

## 2014-08-21 NOTE — Progress Notes (Signed)
Utilization review completed. Ethelyn Cerniglia, RN, BSN. 

## 2014-08-21 NOTE — Interval H&P Note (Signed)
History and Physical Interval Note:  08/21/2014 2:26 PM  [name] DikeJennifer A Xxxford  has presented today for surgery, with the diagnosis of LEFT MEDIAL TIBIAL PLATEAU FRACTURE  The various methods of treatment have been discussed with the patient and family. After consideration of risks, benefits and other options for treatment, the patient has consented to  Procedure(s): OPEN REDUCTION INTERNAL FIXATION (ORIF) TIBIAL PLATEAU (Left) as a surgical intervention .  The patient's history has been reviewed, patient examined, no change in status, stable for surgery.  I have reviewed the patient's chart and labs.  Questions were answered to the patient's satisfaction.     Armand Preast C

## 2014-08-21 NOTE — Anesthesia Preprocedure Evaluation (Addendum)
Anesthesia Evaluation  Patient identified by MRN, date of birth, ID band Patient awake    Reviewed: Allergy & Precautions, H&P , NPO status , Patient's Chart, lab work & pertinent test results  Airway Mallampati: II   Neck ROM: Full  Mouth opening: Limited Mouth Opening  Dental  (+) Teeth Intact, Dental Advisory Given   Pulmonary neg pulmonary ROS,  breath sounds clear to auscultation        Cardiovascular negative cardio ROS  Rhythm:Regular Rate:Normal     Neuro/Psych negative neurological ROS  negative psych ROS   GI/Hepatic negative GI ROS, Neg liver ROS,   Endo/Other  negative endocrine ROS  Renal/GU negative Renal ROS     Musculoskeletal negative musculoskeletal ROS (+)   Abdominal   Peds  Hematology  (+) anemia ,   Anesthesia Other Findings   Reproductive/Obstetrics negative OB ROS                            Anesthesia Physical  Anesthesia Plan  ASA: I  Anesthesia Plan: General   Post-op Pain Management:    Induction: Intravenous  Airway Management Planned: Oral ETT  Additional Equipment: None  Intra-op Plan:   Post-operative Plan: Extubation in OR  Informed Consent: I have reviewed the patients History and Physical, chart, labs and discussed the procedure including the risks, benefits and alternatives for the proposed anesthesia with the patient or authorized representative who has indicated his/her understanding and acceptance.   Dental advisory given  Plan Discussed with: CRNA  Anesthesia Plan Comments:        Anesthesia Quick Evaluation

## 2014-08-21 NOTE — Clinical Social Work Note (Signed)
CSW was consulted to assist with a court appearance discrepancy.  Patient reports having to appear in court today and [facility]  CSW completed request and faxed letter to: [phone].  No further CSW needs identified.  CSW signing off, please re-consult should a CSW need arise.  Vickii PennaGina Gabriel Conry, LCSWA [phone]  Psychiatric & Orthopedics (5N 1-8) Clinical Social Worker

## 2014-08-21 NOTE — Anesthesia Postprocedure Evaluation (Signed)
Anesthesia Post Note  Patient: [name] DikeJennifer A Xxxford  Procedure(s) Performed: Procedure(s) (LRB): OPEN REDUCTION INTERNAL FIXATION (ORIF) TIBIAL PLATEAU (Left)  Anesthesia type: General  Patient location: PACU  Post pain: Pain level controlled  Post assessment: Post-op Vital signs reviewed  Last Vitals: BP 137/72 mmHg  Pulse 60  Temp(Src) 36.9 C (Oral)  Resp 15  Ht 5\' 8"  (1.727 m)  Wt 156 lb (70.761 kg)  BMI 23.73 kg/m2  SpO2 100%  LMP 08/02/2014  Post vital signs: Reviewed  Level of consciousness: sedated  Complications: No apparent anesthesia complications

## 2014-08-21 NOTE — Anesthesia Procedure Notes (Signed)
Procedure Name: Intubation Date/Time: 08/21/2014 3:15 PM Performed by: Roney MansSMITH, Madilynn P Pre-anesthesia Checklist: Patient identified, Timeout performed, Emergency Drugs available, Suction available and Patient being monitored Patient Re-evaluated:Patient Re-evaluated prior to inductionOxygen Delivery Method: Circle system utilized Preoxygenation: Pre-oxygenation with 100% oxygen Intubation Type: IV induction Ventilation: Mask ventilation without difficulty Laryngoscope Size: Mac and 3 Grade View: Grade I Tube type: Oral Tube size: 7.0 mm Number of attempts: 1 Airway Equipment and Method: Stylet Placement Confirmation: ETT inserted through vocal cords under direct vision,  breath sounds checked- equal and bilateral and positive ETCO2 Secured at: 21 cm Tube secured with: Tape Dental Injury: Teeth and Oropharynx as per pre-operative assessment

## 2014-08-22 ENCOUNTER — Encounter (HOSPITAL_COMMUNITY): Payer: Self-pay | Admitting: Orthopaedic Surgery

## 2014-08-22 NOTE — Op Note (Signed)
NAMStarr [name]:  [name], [name]            ACCOUNT NO.:  [id number]  MEDICAL RECORD NO.:  [id number]  LOCATION:  [id number]                        FACILITY:  [facility]  PHYSICIAN:  Aurea Aronov C. Ophelia CharterYates, M.D.    DATE OF BIRTH:  1995-07-20  DATE OF PROCEDURE:  08/21/2014 DATE OF DISCHARGE:                              OPERATIVE REPORT   PREOPERATIVE DIAGNOSES:  Medial tibial plateau fracture and proximal fibular fracture, left knee.  POSTOPERATIVE DIAGNOSES:  Medial tibial plateau fracture and proximal fibular fracture, left knee.  PROCEDURE PERFORMED:  Open reduction internal fixation, left medial tibial plateau fracture, medial buttress plate, Biomet/Zimmer 3-hole extra wide plate.  SURGEON:  Liddy Deam C. Ophelia CharterYates, M.D.  ASSISTANT:  [name] ChurnJames M. Barry Dieneswens, PA-C, medically necessary and present for the entire procedure.  TOURNIQUET TIME:  Less than an hour.  PROCEDURE DESCRIPTION:  After induction of general anesthesia and orotracheal intubation, a lazy-S shaped curve was made with distal aspect running along the edge of the patellar tendon insertion in the tibial tubercle.  Proximally periosteum was peeled off with the bone up to the joint.  Superficial retinaculum was divided, but not to deep retinaculum, and a Glorious PeachFreer was inserted under x-ray visualization shelving up to central joint depression in the medial tibial plateau and then holding with the K-wire.  Once this was reduced, the large medial cortical piece, which was widened, was pushed under pressure and then secured with multiple K-wires checked under fluoroscopy and then an extra wide titanium single plate was custom bent, so that it would fashion and support medial tibial plateau.  Proximally cannulated lag screws were placed and care was taken to make sure that they entered into the posterior aspect of the tibial eminence and extended to catch the cortex, since the patient had involvement of the tibial eminence with laxity of the PCL.  ACL surprisingly  was snug on testing.  Distal 3 screws were placed.  Plate was against the bone, checked on fluoroscopy and cortical screws were between 32 and 34 mm.  AP lateral rotation, spot fluoro pictures were taken.  Irrigation with saline solution. Tourniquet deflation and then standard closure with reapproximation of the periosteum.  The plate was slipped underneath the semitendinosus and gracilis, and stab incision was made horizontally between the 2 tendons for the second of the bottom plate, and then the third screw fit below the other tendon, so that no portion of the tendinous insertion into the crest of the gracilis and semitendinosus had to removed.  Screws were powered in and then completed with hand tightening.  Final spot pictures were taken.  Irrigation with saline solution.  Tourniquet deflation. Closure of periosteum with #1 Vicryl covering the plate.  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  Dermabond was applied, postop dressing, knee mobilizer.  The patient tolerated the procedure well.  Postfixation, there was still some laxity at the posterior cruciate ligament, and she will need to be mobilized postoperatively appropriately.  Under fluoroscopy with extension, the posterior aspect of the tibial eminence did not displace and stayed in good position. The patient tolerated the procedure well.  Marcaine was infiltrated in the skin.  A 10 mL placed into the joint for postoperative analgesia and then transferred to recovery room.     Cristo Peddie C. Ophelia Charter, M.D.     MCY/MEDQ  D:  08/21/2014  T:  08/22/2014  Job:  [id number]

## 2014-08-22 NOTE — Progress Notes (Signed)
Subjective: 1 Day Post-Op Procedure(s) (LRB): OPEN REDUCTION INTERNAL FIXATION (ORIF) TIBIAL PLATEAU (Left) Patient reports pain as moderate.    Objective: Vital signs in last 24 hours: Temp:  [97.7 F (36.5 C)-99.7 F (37.6 C)] 99.7 F (37.6 C) (03/29 0544) Pulse Rate:  [54-77] 77 (03/29 0544) Resp:  [14-17] 16 (03/29 0544) BP: (109-141)/(50-100) 128/73 mmHg (03/29 0544) SpO2:  [98 %-100 %] 100 % (03/29 0544)  Intake/Output from previous day: 03/28 0701 - 03/29 0700 In: 1240 [P.O.:240; I.V.:1000] Out: 400 [Urine:400] Intake/Output this shift:     Recent Labs  08/20/14 0101  HGB 11.1*    Recent Labs  08/20/14 0101  WBC 8.5  RBC 4.19  HCT 34.8*  PLT 214    Recent Labs  08/20/14 0101  NA 140  K 3.4*  CL 106  CO2 24  BUN 12  CREATININE 1.02  GLUCOSE 82  CALCIUM 9.2    Recent Labs  08/20/14 0101  INR 1.17    Neurologically intact  Assessment/Plan: 1 Day Post-Op Procedure(s) (LRB): OPEN REDUCTION INTERNAL FIXATION (ORIF) TIBIAL PLATEAU (Left) Up with therapy possible home tomorrow.   [name] [name] C 08/22/2014, 7:51 AM

## 2014-08-22 NOTE — Evaluation (Signed)
Physical Therapy Evaluation Patient Details Name: [name] [name] MRN: [id number] DOB: 1995/10/15 Today's Date: 08/22/2014   History of Present Illness  Pt is s/p ORIF for L tibial plateau fracture on 08/21/2014.  Clinical Impression  Pt is s/p ORIF L tibial plateau resulting in the deficits listed below (see PT Problem List). Pt demonstrated ability to ambulate NWB on L LE, and demonstrated functional mobility with min assist/min guard. Pt will benefit from skilled PT to increase their independence and safety with mobility to allow discharge to the venue listed below.     Follow Up Recommendations No PT follow up;Supervision - Intermittent    Equipment Recommendations  Crutches    Recommendations for Other Services       Precautions / Restrictions Precautions Precautions: Other (comment) (No ROM/exercises) Required Braces or Orthoses: Knee Immobilizer - Left Knee Immobilizer - Left: On at all times Restrictions Weight Bearing Restrictions: Yes LLE Weight Bearing: Non weight bearing      Mobility  Bed Mobility Overal bed mobility: Needs Assistance Bed Mobility: Supine to Sit     Supine to sit: Min guard     General bed mobility comments: v/c's for hand placement without rails and sitting from supine with a flat bed  Transfers Overall transfer level: Needs assistance Equipment used: Rolling walker (2 wheeled);Crutches Transfers: Sit to/from Stand Sit to Stand: Min guard         General transfer comment: v/c's needed for hand placement and for management of RW/crutches with transferring  Ambulation/Gait Ambulation/Gait assistance: Min guard Ambulation Distance (Feet): 200 Feet Assistive device: Rolling walker (2 wheeled);Crutches (Ambulated about 150 feet with crutches) Gait Pattern/deviations: Step-to pattern     General Gait Details: Able to navigate well with crutches and with NWB in L LE, needed v/c's for crutch management, but overall did well with  crutches  Stairs Stairs: Yes Stairs assistance: Min assist Stair Management: Backwards;With walker Number of Stairs: 2 General stair comments: v/c's for walker management and for giving directional cues to dad. attempted side s tep for stair navigation, but feels she is not strong enough to hop sideways  Wheelchair Mobility    Modified Rankin (Stroke Patients Only)       Balance Overall balance assessment: Needs assistance         Standing balance support: Bilateral upper extremity supported;During functional activity (Due to NWB standing) Standing balance-Leahy Scale: Poor Standing balance comment: Able to balance well on R LE during functional activity                             Pertinent Vitals/Pain Pain Assessment: 0-10 Pain Score: 8  Pain Location: L LE Pain Descriptors / Indicators: Sore Pain Intervention(s): Monitored during session    Home Living Family/patient expects to be discharged to:: Private residence Living Arrangements: Parent Available Help at Discharge: Family;Friend(s);Available PRN/intermittently Type of Home: House Home Access: Stairs to enter Entrance Stairs-Rails: Right;Left (cant reach both) Entrance Stairs-Number of Steps: 5 Home Layout: One level Home Equipment: None Additional Comments: Dad and step mom work full time, friend might be able to help PRN    Prior Function Level of Independence: Independent               Hand Dominance        Extremity/Trunk Assessment   Upper Extremity Assessment: Overall WFL for tasks assessed           Lower Extremity Assessment: LLE deficits/detail  LLE Deficits / Details: NWB, no ROM/exercises  Cervical / Trunk Assessment: Normal  Communication   Communication: No difficulties  Cognition Arousal/Alertness: Awake/alert Behavior During Therapy: WFL for tasks assessed/performed Overall Cognitive Status: Within Functional Limits for tasks assessed                       General Comments      Exercises        Assessment/Plan    PT Assessment Patient needs continued PT services  PT Diagnosis Difficulty walking;Abnormality of gait;Generalized weakness;Acute pain   PT Problem List Decreased strength;Decreased range of motion;Decreased activity tolerance;Decreased mobility;Decreased knowledge of use of DME;Pain  PT Treatment Interventions DME instruction;Gait training;Stair training;Functional mobility training;Therapeutic activities;Patient/family education   PT Goals (Current goals can be found in the Care Plan section) Acute Rehab PT Goals Patient Stated Goal: Return home PT Goal Formulation: With patient Time For Goal Achievement: 09/05/14 Potential to Achieve Goals: Good    Frequency Min 3X/week   Barriers to discharge Decreased caregiver support Dad and stepmom not available 24 hours, may or may not have friend at home to help    Co-evaluation               End of Session Equipment Utilized During Treatment: Left knee immobilizer;Gait belt Activity Tolerance: Patient tolerated treatment well Patient left: in chair;with call bell/phone within reach Nurse Communication: Mobility status         Time: [id number] PT Time Calculation (min) (ACUTE ONLY): 39 min   Charges:   PT Evaluation $Initial PT Evaluation Tier I: 1 Procedure PT Treatments $Gait Training: 23-37 mins   PT G Codes:        Donnita Farina 08/22/2014, 12:02 PM  Vella RaringKailee Letanya Froh, SPT (student physical therapist) Office phone: [phone]

## 2014-08-23 NOTE — Progress Notes (Signed)
Physical Therapy Treatment Patient Details Name: ARISBEL MAIONE MRN: [id number] DOB: 1996-05-16 Today's Date: 08/23/2014    History of Present Illness Pt is s/p ORIF for L tibial plateau fracture on 08/21/2014.    PT Comments    Pt progressing towards PT goals.  Pt demonstrated knowledge of safe transfers, ambulation and stair navigation with crutches.  Current plan to return home with intermittent care and no follow up PT remains appropriate per medical clearance and d/c.    Follow Up Recommendations  No PT follow up;Supervision - Intermittent     Equipment Recommendations  Crutches    Recommendations for Other Services       Precautions / Restrictions Precautions Precautions: Other (comment) ((No ROM/exercises) ) Required Braces or Orthoses: Knee Immobilizer - Left Knee Immobilizer - Left: On at all times Restrictions Weight Bearing Restrictions: Yes LLE Weight Bearing: Non weight bearing    Mobility  Bed Mobility Overal bed mobility: Needs Assistance Bed Mobility: Supine to Sit     Supine to sit: Supervision     General bed mobility comments: Demonstrated knowledge of getting up without using hand rails and from a flat bed with good L LE management maintaining NWB status  Transfers Overall transfer level: Needs assistance Equipment used: Crutches Transfers: Sit to/from Stand Sit to Stand: Supervision         General transfer comment: v/c's for hand placement and crutch placement for safe transfer sit to stand  Ambulation/Gait Ambulation/Gait assistance: Supervision Ambulation Distance (Feet): 100 Feet Assistive device: Crutches Gait Pattern/deviations: WFL(Within Functional Limits) (Hop due to NWB status L)     General Gait Details: Able to navigate well with crutches and with NWB in L LE, demonstrated knowledge of safe ambulation with crutches maintaining NWB status with no v/c's   Stairs Stairs: Yes Stairs assistance: Min guard Stair  Management: Backwards;With crutches Number of Stairs: 6 General stair comments: v/c's for safe crutch placement, able to demonstrate proper technique on 2 steps without v/c's for crutch placement  Wheelchair Mobility    Modified Rankin (Stroke Patients Only)       Balance Overall balance assessment: Needs assistance         Standing balance support: Bilateral upper extremity supported;During functional activity (Due to NWB status L LE) Standing balance-Leahy Scale: Poor (Due to NWB status L LE) Standing balance comment: Able to balance well on R LE during ambulation and functional activity                    Cognition Arousal/Alertness: Awake/alert Behavior During Therapy: WFL for tasks assessed/performed Overall Cognitive Status: Within Functional Limits for tasks assessed                      Exercises      General Comments        Pertinent Vitals/Pain Pain Assessment: 0-10 Pain Score: 7  Pain Location: L LE Pain Descriptors / Indicators: Sore Pain Intervention(s): Monitored during session    Home Living                      Prior Function            PT Goals (current goals can now be found in the care plan section) Acute Rehab PT Goals Patient Stated Goal: Return home Progress towards PT goals: Progressing toward goals    Frequency  Min 3X/week    PT Plan Current plan remains appropriate    Co-evaluation  End of Session Equipment Utilized During Treatment: Left knee immobilizer;Gait belt Activity Tolerance: Patient tolerated treatment well Patient left: in chair;with call bell/phone within reach     Time: 0801-0820 PT Time Calculation (min) (ACUTE ONLY): 19 min  Charges:  $Gait Training: 8-22 mins                    G Codes:      Josilynn Losh 08/23/2014, 9:50 AM  Vella RaringKailee Janard Culp, SPT (student physical therapist) Office phone: [phone]

## 2014-08-23 NOTE — Progress Notes (Signed)
Orthopedic Tech Progress Note Patient Details:  [name] GraffJennifer A [name] 01-31-1996 [id number]  Ortho Devices Type of Ortho Device: Crutches Ortho Device/Splint Interventions: Application   Nikki DomCrawford, [name] 08/23/2014, 8:37 AM

## 2014-08-23 NOTE — Progress Notes (Signed)
Subjective: Seen this AM.  Doing well.  Pain controlled. Progressing with PT.    Objective: Vital signs in last 24 hours: Temp:  [98.5 F (36.9 C)-99.4 F (37.4 C)] 98.6 F (37 C) (03/30 0428) Pulse Rate:  [60-89] 60 (03/30 0428) Resp:  [16] 16 (03/30 0428) BP: (109-134)/(47-80) 109/47 mmHg (03/30 0428) SpO2:  [100 %] 100 % (03/30 0428)  Intake/Output from previous day: 03/29 0701 - 03/30 0700 In: 1577.3 [P.O.:240; I.V.:1337.3] Out: -  Intake/Output this shift: Total I/O In: 240 [P.O.:240] Out: -   No results for input(s): HGB in the last 72 hours. No results for input(s): WBC, RBC, HCT, PLT in the last 72 hours. No results for input(s): NA, K, CL, CO2, BUN, CREATININE, GLUCOSE, CALCIUM in the last 72 hours. No results for input(s): LABPT, INR in the last 72 hours.  Exam:  Dressing CDI.  Knee immobilizer on.  NVI.    Assessment/Plan: Continue PT. Possible d/c home tomorrow.  Crutches ordered.     Lis Savitt M 08/23/2014, 12:34 PM

## 2014-08-24 ENCOUNTER — Encounter (HOSPITAL_COMMUNITY): Payer: Self-pay | Admitting: Orthopaedic Surgery

## 2014-08-24 MED ORDER — FLUCONAZOLE 150 MG PO TABS
150.0000 mg | ORAL_TABLET | Freq: Once | ORAL | Status: AC
  Administered 2014-08-24: 150 mg via ORAL
  Filled 2014-08-24: qty 1

## 2014-08-24 MED ORDER — OXYCODONE-ACETAMINOPHEN 5-325 MG PO TABS: 2.0000 | ORAL_TABLET | ORAL | Status: DC | PRN

## 2014-08-24 NOTE — Discharge Instructions (Addendum)
Cast or Splint Care Casts and splints support injured limbs and keep bones from moving while they heal.  HOME CARE  Keep the cast or splint uncovered during the drying period.  A plaster cast can take 24 to 48 hours to dry.  A fiberglass cast will dry in less than 1 hour.  Do not rest the cast on anything harder than a pillow for 24 hours.  Do not put weight on your injured limb. Do not put pressure on the cast. Wait for your doctor's approval.  Keep the cast or splint dry.  Cover the cast or splint with a plastic bag during baths or wet weather.  If you have a cast over your chest and belly (trunk), take sponge baths until the cast is taken off.  If your cast gets wet, dry it with a towel or blow dryer. Use the cool setting on the blow dryer.  Keep your cast or splint clean. Wash a dirty cast with a damp cloth.  Do not put any objects under your cast or splint.  Do not scratch the skin under the cast with an object. If itching is a problem, use a blow dryer on a cool setting over the itchy area.  Do not trim or cut your cast.  Do not take out the padding from inside your cast.  Exercise your joints near the cast as told by your doctor.  Raise (elevate) your injured limb on 1 or 2 pillows for the first 1 to 3 days. GET HELP IF:  Your cast or splint cracks.  Your cast or splint is too tight or too loose.  You itch badly under the cast.  Your cast gets wet or has a soft spot.  You have a bad smell coming from the cast.  You get an object stuck under the cast.  Your skin around the cast becomes red or sore.  You have new or more pain after the cast is put on. GET HELP RIGHT AWAY IF:  You have fluid leaking through the cast.  You cannot move your fingers or toes.  Your fingers or toes turn blue or white or are cool, painful, or puffy (swollen).  You have tingling or lose feeling (numbness) around the injured area.  You have bad pain or pressure under the  cast.  You have trouble breathing or have shortness of breath.  You have chest pain. Document Released: 09/11/2010 Document Revised: 01/12/2013 Document Reviewed: 11/18/2012 [facility] Patient Information 2015 [employer]. This information is not intended to replace advice given to you by your health care provider. Make sure you discuss any questions you have with your health care provider. KEEP LEG ELEVATED ABOVE HEART LEVEL TO DECREASE PAIN AND SWELLING.

## 2014-08-24 NOTE — Progress Notes (Signed)
Subjective: 3 Days Post-Op Procedure(s) (LRB): OPEN REDUCTION INTERNAL FIXATION (ORIF) TIBIAL PLATEAU (Left) Patient reports pain as mild.    Objective: Vital signs in last 24 hours: Temp:  [98.2 F (36.8 C)-99.1 F (37.3 C)] 98.2 F (36.8 C) (03/31 0456) Pulse Rate:  [64-80] 64 (03/31 0456) Resp:  [14-16] 16 (03/31 0456) BP: (121-127)/(61-67) 121/61 mmHg (03/31 0456) SpO2:  [97 %-100 %] 100 % (03/31 0456)  Intake/Output from previous day: 03/30 0701 - 03/31 0700 In: 720 [P.O.:720] Out: -  Intake/Output this shift:    No results for input(s): HGB in the last 72 hours. No results for input(s): WBC, RBC, HCT, PLT in the last 72 hours. No results for input(s): NA, K, CL, CO2, BUN, CREATININE, GLUCOSE, CALCIUM in the last 72 hours. No results for input(s): LABPT, INR in the last 72 hours.  Neurologically intact  Assessment/Plan: 3 Days Post-Op Procedure(s) (LRB): OPEN REDUCTION INTERNAL FIXATION (ORIF) TIBIAL PLATEAU (Left) Plan : discharge Home office one week  [name] [name] C 08/24/2014, 7:50 AM

## 2014-08-29 ENCOUNTER — Encounter (HOSPITAL_COMMUNITY): Payer: Self-pay | Admitting: Orthopaedic Surgery

## 2014-09-05 ENCOUNTER — Encounter (HOSPITAL_COMMUNITY): Payer: Self-pay

## 2014-09-05 ENCOUNTER — Emergency Department (HOSPITAL_COMMUNITY)
Admission: EM | Admit: 2014-09-05 | Discharge: 2014-09-05 | Disposition: A | Discharge status: Home / Self Care | Payer: Self-pay | Attending: Emergency Medicine | Admitting: Emergency Medicine

## 2014-09-05 ENCOUNTER — Emergency Department (HOSPITAL_COMMUNITY): Payer: Self-pay

## 2014-09-05 DIAGNOSIS — Z9889 Other specified postprocedural states: Secondary | ICD-10-CM

## 2014-09-05 DIAGNOSIS — M25562 Pain in left knee: Secondary | ICD-10-CM | POA: Insufficient documentation

## 2014-09-05 DIAGNOSIS — Z862 Personal history of diseases of the blood and blood-forming organs and certain disorders involving the immune mechanism: Secondary | ICD-10-CM | POA: Insufficient documentation

## 2014-09-05 NOTE — Discharge Instructions (Signed)
i have called and made you a new appt with Dr. Ophelia CharterYates at 2pm on 09/19/14.  If you need to change this appt please call their office. Return here for new concerns.

## 2014-09-05 NOTE — ED Notes (Signed)
Patient transported to X-ray 
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
## 2014-10-16 ENCOUNTER — Ambulatory Visit: Payer: Self-pay | Admitting: Physical Therapy

## 2015-07-01 ENCOUNTER — Emergency Department (HOSPITAL_COMMUNITY): Payer: Self-pay

## 2015-07-01 ENCOUNTER — Encounter (HOSPITAL_COMMUNITY): Payer: Self-pay | Admitting: *Deleted

## 2015-07-01 ENCOUNTER — Emergency Department (HOSPITAL_COMMUNITY)
Admission: EM | Admit: 2015-07-01 | Discharge: 2015-07-01 | Disposition: A | Discharge status: Home / Self Care | Payer: Self-pay | Attending: Emergency Medicine | Admitting: Emergency Medicine

## 2015-07-01 DIAGNOSIS — S0083XA Contusion of other part of head, initial encounter: Secondary | ICD-10-CM | POA: Insufficient documentation

## 2015-07-01 DIAGNOSIS — Z862 Personal history of diseases of the blood and blood-forming organs and certain disorders involving the immune mechanism: Secondary | ICD-10-CM | POA: Insufficient documentation

## 2015-07-01 DIAGNOSIS — Y998 Other external cause status: Secondary | ICD-10-CM | POA: Insufficient documentation

## 2015-07-01 DIAGNOSIS — Y9289 Other specified places as the place of occurrence of the external cause: Secondary | ICD-10-CM | POA: Insufficient documentation

## 2015-07-01 DIAGNOSIS — Z8781 Personal history of (healed) traumatic fracture: Secondary | ICD-10-CM | POA: Insufficient documentation

## 2015-07-01 DIAGNOSIS — Y9389 Activity, other specified: Secondary | ICD-10-CM | POA: Insufficient documentation

## 2015-07-01 DIAGNOSIS — S40011A Contusion of right shoulder, initial encounter: Secondary | ICD-10-CM | POA: Insufficient documentation

## 2015-07-01 MED ORDER — METHOCARBAMOL 500 MG PO TABS: 500.0000 mg | ORAL_TABLET | Freq: Two times a day (BID) | ORAL | Status: DC

## 2015-07-01 MED ORDER — IBUPROFEN 800 MG PO TABS: 800.0000 mg | ORAL_TABLET | Freq: Three times a day (TID) | ORAL | Status: DC

## 2015-07-01 NOTE — Discharge Instructions (Signed)
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
## 2015-07-16 IMAGING — CR DG KNEE COMPLETE 4+V*L*
4 series · 4 of 4 positions shown · non-contrast
Comparison: CT left knee August 20, 2014; left knee radiographs
August 21, 2014

CLINICAL DATA: Recent repair for medial tibial plateau fracture
with new trauma and pain

EXAM:
LEFT KNEE - COMPLETE 4+ VIEW

[knee ap]
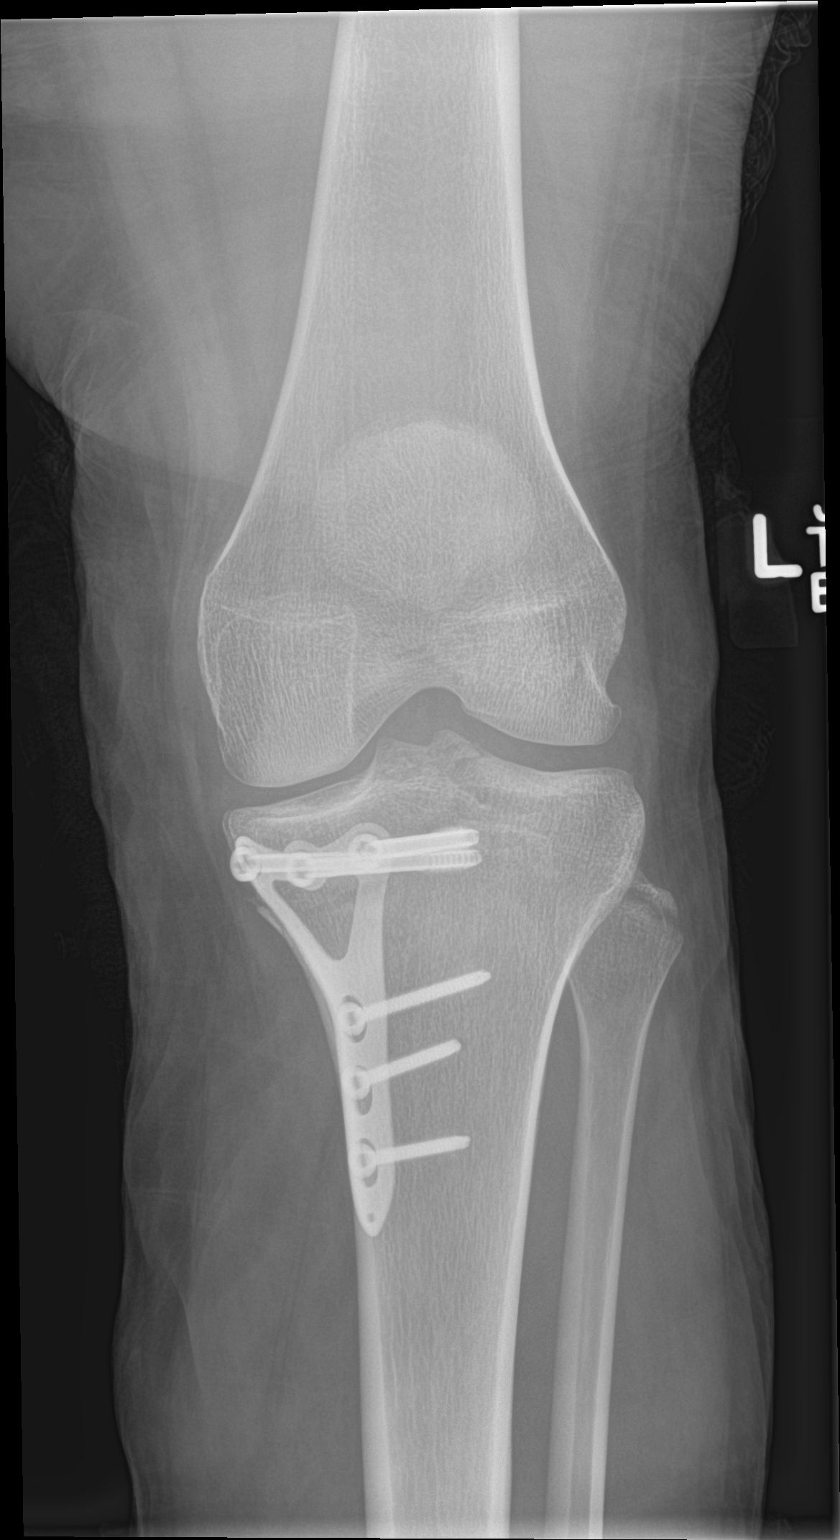

[knee obl (1 of 2)]
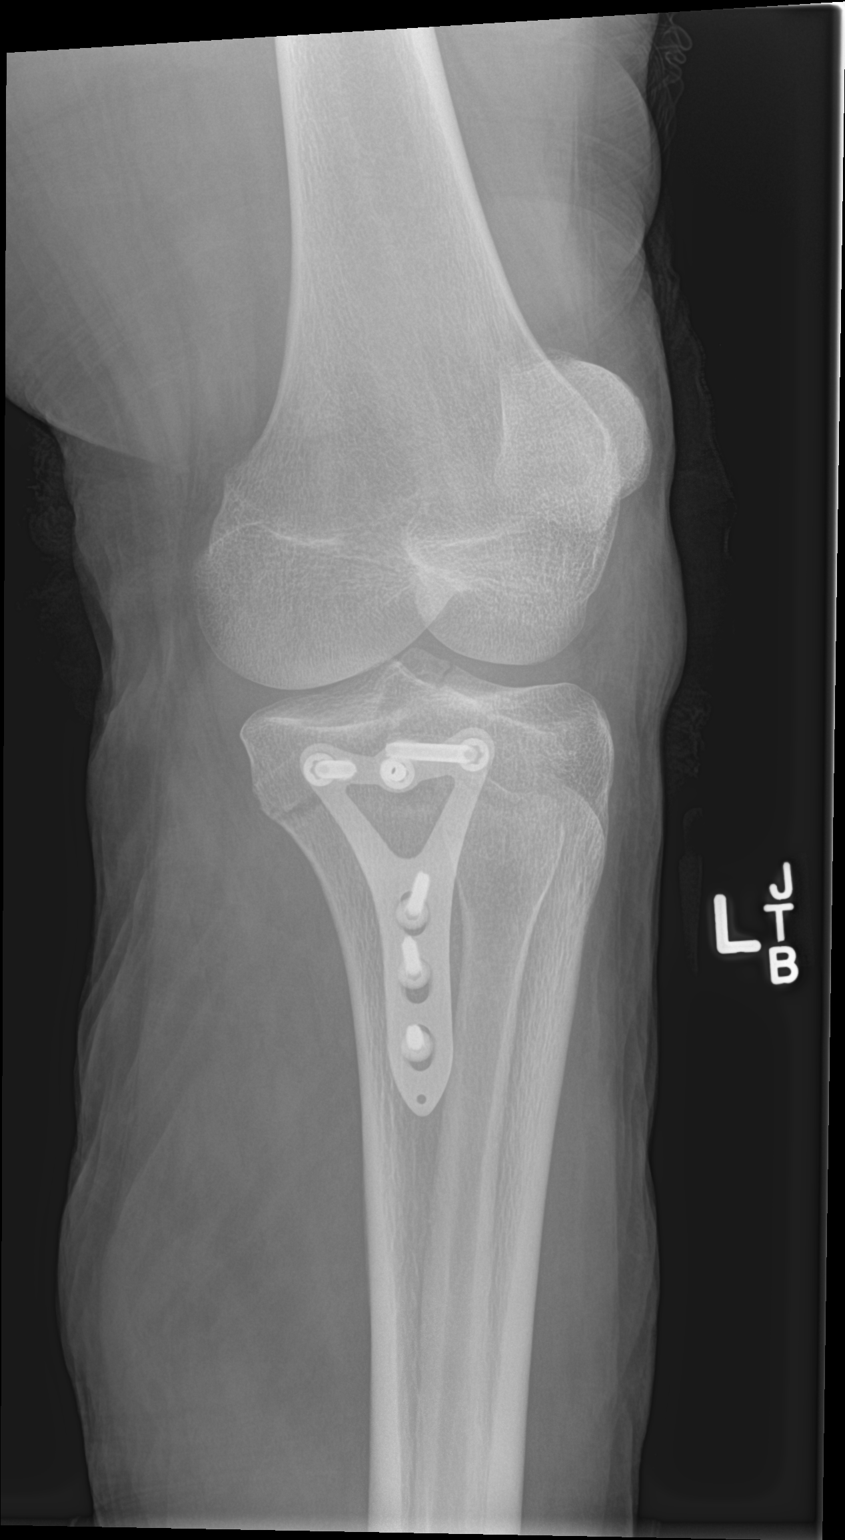

[knee obl (2 of 2)]
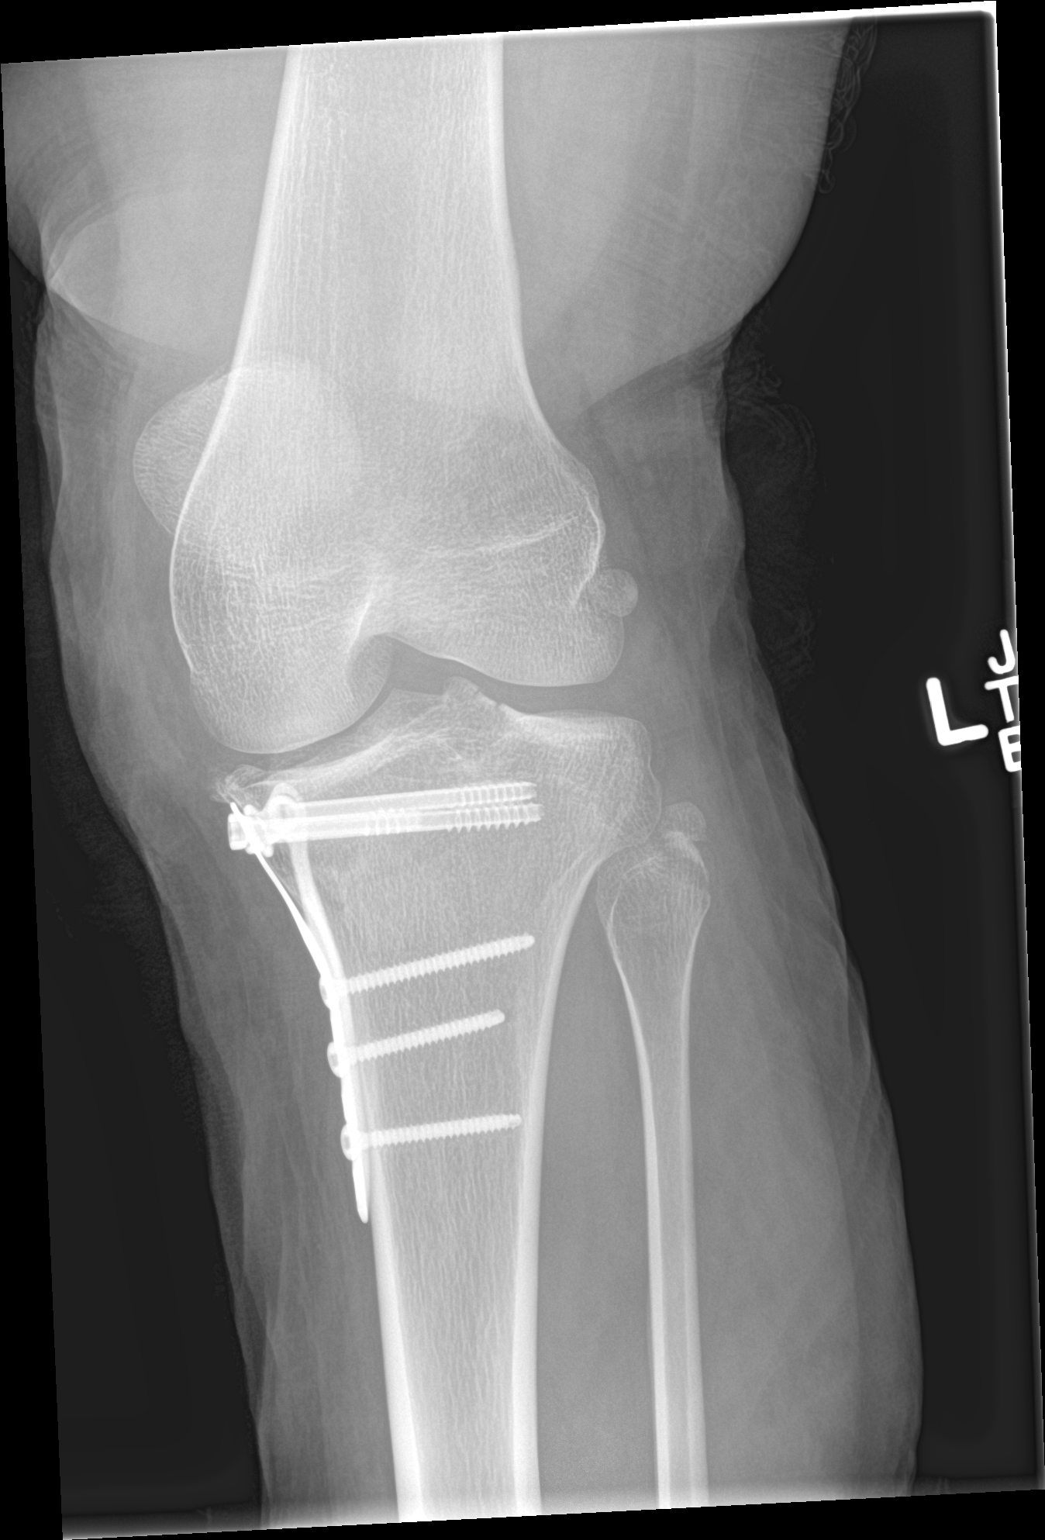

[knee lat]
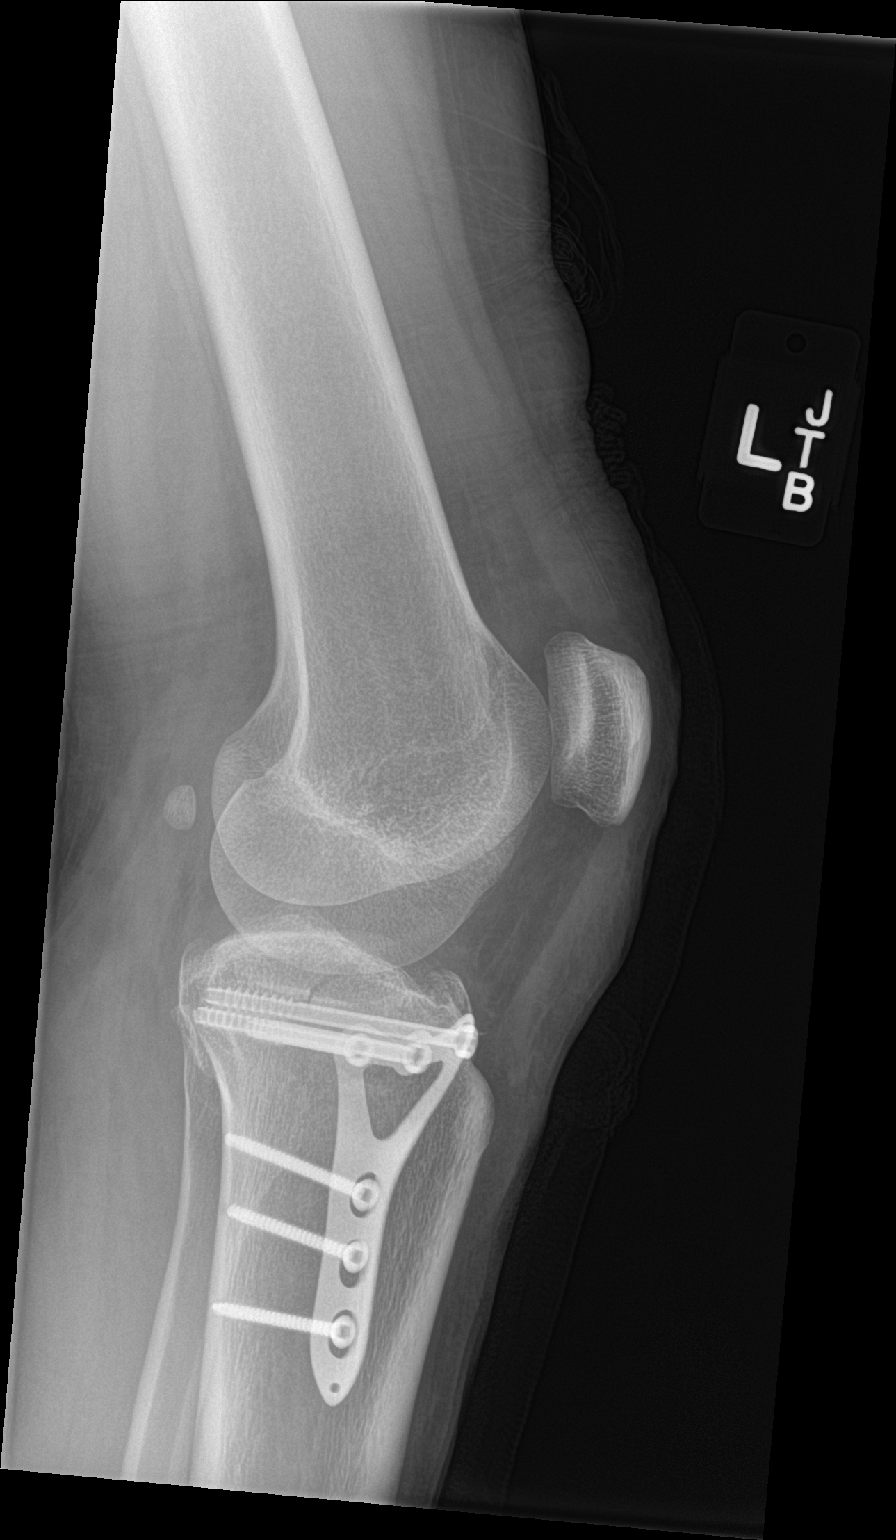

[4 of 4 positions shown; findings below may reference images not displayed]

FINDINGS: Frontal, lateral, and bilateral oblique views were obtained. Patient
has had surgical repair for tibial plateau depression fracture on
the left. Screw and plate fixation device appears intact without
appreciable depression in this area. Fractures extending along the
intracondylar regions appears stable and in essentially anatomic
alignment. There is a fracture of the proximal fibula which appears
stable. No new fracture is apparent. No dislocation. No appreciable
knee joint effusion. There is no appreciable joint space narrowing.
IMPRESSION: Stable postoperative change in the medial tibial plateau region.
Fractures in the intracondylar regions are in anatomic alignment and
are stable. Fracture of the proximal fibula in near anatomic
alignment is also stable. No new fracture. No dislocation or
effusion apparent.
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
## 2016-08-25 ENCOUNTER — Encounter: Payer: Self-pay | Admitting: *Deleted

## 2016-08-25 ENCOUNTER — Encounter: Payer: Self-pay | Admitting: Adult Health

## 2018-12-19 ENCOUNTER — Other Ambulatory Visit: Payer: Self-pay

## 2018-12-19 ENCOUNTER — Encounter (HOSPITAL_COMMUNITY): Payer: Self-pay | Admitting: Emergency Medicine

## 2018-12-19 ENCOUNTER — Emergency Department (HOSPITAL_COMMUNITY)
Admission: EM | Admit: 2018-12-19 | Discharge: 2018-12-19 | Disposition: A | Discharge status: Home / Self Care | Payer: Medicaid Other | Attending: Emergency Medicine | Admitting: Emergency Medicine

## 2018-12-19 DIAGNOSIS — Z79899 Other long term (current) drug therapy: Secondary | ICD-10-CM | POA: Insufficient documentation

## 2018-12-19 DIAGNOSIS — Z20828 Contact with and (suspected) exposure to other viral communicable diseases: Secondary | ICD-10-CM | POA: Insufficient documentation

## 2018-12-19 DIAGNOSIS — R079 Chest pain, unspecified: Secondary | ICD-10-CM

## 2018-12-19 DIAGNOSIS — R0789 Other chest pain: Secondary | ICD-10-CM | POA: Diagnosis not present

## 2018-12-19 LAB — LIPASE, BLOOD: Lipase: 20 U/L (ref 11–51)

## 2018-12-19 LAB — CBC WITH DIFFERENTIAL/PLATELET
Abs Immature Granulocytes: 0.03 10*3/uL (ref 0.00–0.07)
Basophils Absolute: 0 10*3/uL (ref 0.0–0.1)
Basophils Relative: 0 %
Eosinophils Absolute: 0.1 10*3/uL (ref 0.0–0.5)
Eosinophils Relative: 1 %
HCT: 31.7 % — ABNORMAL LOW (ref 36.0–46.0)
Hemoglobin: 10.1 g/dL — ABNORMAL LOW (ref 12.0–15.0)
Immature Granulocytes: 0 %
Lymphocytes Relative: 29 %
Lymphs Abs: 2.4 10*3/uL (ref 0.7–4.0)
MCH: 24.2 pg — ABNORMAL LOW (ref 26.0–34.0)
MCHC: 31.9 g/dL (ref 30.0–36.0)
MCV: 76 fL — ABNORMAL LOW (ref 80.0–100.0)
Monocytes Absolute: 0.7 10*3/uL (ref 0.1–1.0)
Monocytes Relative: 8 %
Neutro Abs: 5.2 10*3/uL (ref 1.7–7.7)
Neutrophils Relative %: 62 %
Platelets: 285 10*3/uL (ref 150–400)
RBC: 4.17 MIL/uL (ref 3.87–5.11)
RDW: 16.7 % — ABNORMAL HIGH (ref 11.5–15.5)
WBC: 8.5 10*3/uL (ref 4.0–10.5)
nRBC: 0 % (ref 0.0–0.2)

## 2018-12-19 LAB — HEPATIC FUNCTION PANEL
ALT: 14 U/L (ref 0–44)
AST: 17 U/L (ref 15–41)
Albumin: 4.5 g/dL (ref 3.5–5.0)
Alkaline Phosphatase: 66 U/L (ref 38–126)
Bilirubin, Direct: 0.1 mg/dL (ref 0.0–0.2)
Indirect Bilirubin: 0.4 mg/dL (ref 0.3–0.9)
Total Bilirubin: 0.5 mg/dL (ref 0.3–1.2)
Total Protein: 8.2 g/dL — ABNORMAL HIGH (ref 6.5–8.1)

## 2018-12-19 LAB — BASIC METABOLIC PANEL
Anion gap: 14 (ref 5–15)
BUN: 13 mg/dL (ref 6–20)
CO2: 20 mmol/L — ABNORMAL LOW (ref 22–32)
Calcium: 9.1 mg/dL (ref 8.9–10.3)
Chloride: 104 mmol/L (ref 98–111)
Creatinine, Ser: 0.71 mg/dL (ref 0.44–1.00)
GFR calc Af Amer: >60 mL/min (ref >60)
GFR calc non Af Amer: >60 mL/min (ref >60)
Glucose, Bld: 83 mg/dL (ref 70–99)
Potassium: 3.1 mmol/L — ABNORMAL LOW (ref 3.5–5.1)
Sodium: 138 mmol/L (ref 135–145)

## 2018-12-19 LAB — TROPONIN I (HIGH SENSITIVITY)
Troponin I (High Sensitivity): <2 ng/L (ref <18)
Troponin I (High Sensitivity): <2 ng/L (ref <18)
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
## 2018-12-19 NOTE — ED Notes (Signed)
Pt resting in bed.

## 2018-12-19 NOTE — ED Notes (Signed)
Pt still having a slight amount of pressure

## 2018-12-19 NOTE — ED Provider Notes (Signed)
MSE was initiated and I personally evaluated the patient and placed orders (if any) at  6:57 AM on December 19, 2018.  The patient appears stable so that the remainder of the MSE may be completed by another provider.  Patient presents to the emergency department by ambulance for chest pain.  She has been having chest pain for days.  She recently lost a child.  She was seen at an outside hospital in [place] for the symptoms.  She has all of her results available on her phone.  She had blood work which was normal except for mildly elevated d-dimer.  She had CT angiography that was normal.  Troponin was normal.  She was told that it was likely anxiety.  Patient concerned because she is experiencing episodes of chest pain when she is not feeling anxious.  She also is concerned because her father has a history of early heart disease.  She describes the pain as a heaviness in her chest and it gets worse when she talks about the problem or her son.  Symptoms usually only last for a few minutes and then resolve.   Patient with minimal cardiac risk factors.  Will perform basic work-up to reassure her.  Will sign out to oncoming ER physician.   Orpah Greek, MD 12/19/18 [phone]

## 2018-12-19 NOTE — ED Provider Notes (Signed)
Delta troponin is negative.  Patient no acute distress.  Stable for outpatient management.   Nat Christen, MD 12/19/18 [phone]

## 2018-12-19 NOTE — ED Notes (Signed)
Pt states chest pain has subsided

## 2018-12-19 NOTE — ED Triage Notes (Signed)
Patient complains of chest pain x 3 to 4 days ago. Chest pain became worse about an hour ago. EMS gave 1 nitro and 324 mg of ASA today. Patient states cold like symptoms that started last week. Patient states chest pressure. Patient does have a history of anxiety.

## 2018-12-19 NOTE — ED Notes (Signed)
Pt still resting with equal rise and chest fall  

## 2018-12-21 LAB — NOVEL CORONAVIRUS, NAA (HOSP ORDER, SEND-OUT TO REF LAB; TAT 18-24 HRS): SARS-CoV-2, NAA: NOT DETECTED

## 2019-01-05 NOTE — ED Provider Notes (Signed)
[facility] EMERGENCY DEPARTMENT Provider Note   CSN: [id number] Arrival date & time: 12/19/18  4627    History   Chief Complaint Chief Complaint  Patient presents with  . Chest Pain    HPI [name] [name] is a 23 y.o. female.     Patient presents to the emergency department for evaluation of chest pain.  Patient has been experiencing chest pain for some time.  She has been previously worked up for this at an outside hospital in [place].  She presents with results, all of which were unremarkable.  This included CT angiography because of a slightly elevated d-dimer.  Patient continues to have chest pain is concerned because her father had early heart disease.  She was told that her pain was related to anxiety, occasionally experiencing pain when she is not experiencing anxiousness.  Describes the pain as a tightness in her chest.     Past Medical History:  Diagnosis Date  . Anemia    hx    Patient Active Problem List   Diagnosis Date Noted  . Tibial plateau fracture 08/20/2014  . CLOSED FRACTURE OF METATARSAL BONE 01/31/2009    Past Surgical History:  Procedure Laterality Date  . MANDIBULAR HARDWARE REMOVAL N/A 02/21/2014   Procedure: REMOVAL OF MANDIBULAR MAXILLARY FIXATION;  Surgeon: Jodi Marble, MD;  Location: [place];  Service: ENT;  Laterality: N/A;  . ORIF MANDIBULAR FRACTURE Bilateral 01/03/2014   Procedure: MAXILLARY MANDIBULAR FIXATION ,OPEN REDUCTION INTERNAL FIXATION (ORIF) MANDIBULAR FRACTURE, ;  Surgeon: Jodi Marble, MD;  Location: [place];  Service: ENT;  Laterality: Bilateral;  . ORIF TIBIA PLATEAU Left 08/21/2014   Procedure: OPEN REDUCTION INTERNAL FIXATION (ORIF) TIBIAL PLATEAU;  Surgeon: Marybelle Killings, MD;  Location: [place];  Service: Orthopedics;  Laterality: Left;     OB History   No obstetric history on file.      Home Medications    Prior to Admission medications   Medication Sig Start Date End Date Taking? Authorizing Provider  QUEtiapine  (SEROQUEL) 50 MG tablet Take 50 mg by mouth 2 (two) times daily.    Yes [provider]    Family History No family history on file.  Social History Social History   Tobacco Use  . Smoking status: Never Smoker  . Smokeless tobacco: Never Used  Substance Use Topics  . Alcohol use: Yes    Comment: occasionaly  . Drug use: No     Allergies   Chlorhexidine gluconate and Nickel   Review of Systems Review of Systems  Cardiovascular: Positive for chest pain.  Psychiatric/Behavioral: The patient is nervous/anxious.   All other systems reviewed and are negative.    Physical Exam Updated Vital Signs BP 118/82   Pulse 78   Temp 98.2 F (36.8 C) (Oral)   Resp 12   Ht 5\' 10"  (1.778 m)   Wt 105.2 kg   LMP 12/06/2018   SpO2 100%   BMI 33.29 kg/m   Physical Exam Vitals signs and nursing note reviewed.  Constitutional:      General: She is not in acute distress.    Appearance: Normal appearance. She is well-developed.  HENT:     Head: Normocephalic and atraumatic.     Right Ear: Hearing normal.     Left Ear: Hearing normal.     Nose: Nose normal.  Eyes:     Conjunctiva/sclera: Conjunctivae normal.     Pupils: Pupils are equal, round, and reactive to light.  Neck:  Musculoskeletal: Normal range of motion and neck supple.  Cardiovascular:     Rate and Rhythm: Regular rhythm.     Heart sounds: S1 normal and S2 normal. No murmur. No friction rub. No gallop.   Pulmonary:     Effort: Pulmonary effort is normal. No respiratory distress.     Breath sounds: Normal breath sounds.  Chest:     Chest wall: No tenderness.  Abdominal:     General: Bowel sounds are normal.     Palpations: Abdomen is soft.     Tenderness: There is no abdominal tenderness. There is no guarding or rebound. Negative signs include Murphy's sign and McBurney's sign.     Hernia: No hernia is present.  Musculoskeletal: Normal range of motion.  Skin:    General: Skin is warm and dry.      Findings: No rash.  Neurological:     Mental Status: She is alert and oriented to person, place, and time.     GCS: GCS eye subscore is 4. GCS verbal subscore is 5. GCS motor subscore is 6.     Cranial Nerves: No cranial nerve deficit.     Sensory: No sensory deficit.     Coordination: Coordination normal.  Psychiatric:        Speech: Speech normal.        Behavior: Behavior normal.        Thought Content: Thought content normal.      ED Treatments / Results  Labs (all labs ordered are listed, but only abnormal results are displayed) Labs Reviewed  CBC WITH DIFFERENTIAL/PLATELET - Abnormal; Notable for the following components:      Result Value   Hemoglobin 10.1 (*)    HCT 31.7 (*)    MCV 76.0 (*)    MCH 24.2 (*)    RDW 16.7 (*)    All other components within normal limits  BASIC METABOLIC PANEL - Abnormal; Notable for the following components:   Potassium 3.1 (*)    CO2 20 (*)    All other components within normal limits  HEPATIC FUNCTION PANEL - Abnormal; Notable for the following components:   Total Protein 8.2 (*)    All other components within normal limits  NOVEL CORONAVIRUS, NAA (HOSPITAL ORDER, SEND-OUT TO REF LAB)  LIPASE, BLOOD  TROPONIN I (HIGH SENSITIVITY)  TROPONIN I (HIGH SENSITIVITY)    EKG EKG Interpretation  Date/Time:  Sunday December 19 2018 06:42:19 EDT Ventricular Rate:  98 PR Interval:    QRS Duration: 90 QT Interval:  357 QTC Calculation: 456 R Axis:   93 Text Interpretation:  Sinus rhythm Borderline right axis deviation Borderline Q waves in inferior leads Borderline repolarization abnormality Baseline wander in lead(s) II Confirmed by Gilda CreasePollina, Luismiguel Lamere J [phone]([id number]) on 12/19/2018 6:58:25 AM   Radiology No results found.  Procedures Procedures (including critical care time)  Medications Ordered in ED Medications - No data to display   Initial Impression / Assessment and Plan / ED Course  I have reviewed the triage vital signs and the  nursing notes.  Pertinent labs & imaging results that were available during my care of the patient were reviewed by me and considered in my medical decision making (see chart for details).        Patient presents to the ER for evaluation of persistent chest pain.  She has already been evaluated for this at an outside institution.  This evaluation included CT angiography which is available to me and was negative.  She reports a family history of heart disease but does not have any other cardiac risk factors.  EKG is nonspecific.  Will sign out to oncoming ER physician to complete serial troponins and disposition patient.  Final Clinical Impressions(s) / ED Diagnoses   Final diagnoses:  Chest pain, unspecified type    ED Discharge Orders    None       Gilda CreasePollina, Zaneta Lightcap J, MD 01/05/19 [phone]
